# Patient Record
Sex: Male | Born: 1975 | Race: Black or African American | Hispanic: No | Marital: Single | Smoking: Never smoker
Health system: Southern US, Community
[De-identification: ages and names within clinical notes are randomized; demographics above are authoritative.]

## PROBLEM LIST (undated history)

## (undated) DIAGNOSIS — K649 Unspecified hemorrhoids: Secondary | ICD-10-CM

## (undated) HISTORY — DX: Unspecified hemorrhoids: K64.9

---

## 2007-05-19 ENCOUNTER — Emergency Department (HOSPITAL_COMMUNITY): Admission: EM | Admit: 2007-05-19 | Discharge: 2007-05-19 | Payer: Self-pay | Admitting: Emergency Medicine

## 2011-09-29 LAB — URINALYSIS, ROUTINE W REFLEX MICROSCOPIC
Hgb urine dipstick: NEGATIVE
Ketones, ur: NEGATIVE
Protein, ur: NEGATIVE
Urobilinogen, UA: 1

## 2012-04-10 ENCOUNTER — Emergency Department (HOSPITAL_COMMUNITY)
Admission: EM | Admit: 2012-04-10 | Discharge: 2012-04-10 | Disposition: A | Discharge status: Home / Self Care | Payer: Self-pay | Attending: Emergency Medicine | Admitting: Emergency Medicine

## 2012-04-10 ENCOUNTER — Emergency Department (HOSPITAL_COMMUNITY): Payer: Self-pay

## 2012-04-10 ENCOUNTER — Encounter (HOSPITAL_COMMUNITY): Payer: Self-pay | Admitting: *Deleted

## 2012-04-10 DIAGNOSIS — R110 Nausea: Secondary | ICD-10-CM | POA: Insufficient documentation

## 2012-04-10 DIAGNOSIS — R0789 Other chest pain: Secondary | ICD-10-CM | POA: Insufficient documentation

## 2012-04-10 DIAGNOSIS — R1013 Epigastric pain: Secondary | ICD-10-CM

## 2012-04-10 DIAGNOSIS — R10816 Epigastric abdominal tenderness: Secondary | ICD-10-CM | POA: Insufficient documentation

## 2012-04-10 LAB — CBC
MCHC: 34.3 g/dL (ref 30.0–36.0)
MCV: 96.7 fL (ref 78.0–100.0)
Platelets: 173 10*3/uL (ref 150–400)
RDW: 12.9 % (ref 11.5–15.5)
WBC: 3.8 10*3/uL — ABNORMAL LOW (ref 4.0–10.5)

## 2012-04-10 LAB — DIFFERENTIAL
Basophils Absolute: 0.1 10*3/uL (ref 0.0–0.1)
Basophils Relative: 1 % (ref 0–1)
Eosinophils Absolute: 0.1 10*3/uL (ref 0.0–0.7)
Eosinophils Relative: 3 % (ref 0–5)
Lymphocytes Relative: 55 % — ABNORMAL HIGH (ref 12–46)
Monocytes Absolute: 0.3 10*3/uL (ref 0.1–1.0)

## 2012-04-10 LAB — COMPREHENSIVE METABOLIC PANEL
ALT: 9 U/L (ref 0–53)
AST: 22 U/L (ref 0–37)
Albumin: 4.7 g/dL (ref 3.5–5.2)
CO2: 30 mEq/L (ref 19–32)
Calcium: 9.4 mg/dL (ref 8.4–10.5)
Creatinine, Ser: 1.15 mg/dL (ref 0.50–1.35)
Sodium: 139 mEq/L (ref 135–145)
Total Protein: 8.2 g/dL (ref 6.0–8.3)

## 2012-04-10 MED ORDER — GI COCKTAIL ~~LOC~~
30.0000 mL | Freq: Once | ORAL | Status: AC
  Administered 2012-04-10: 30 mL via ORAL
  Filled 2012-04-10: qty 30

## 2012-04-10 MED ORDER — LIDOCAINE VISCOUS 2 % MT SOLN: 20.0000 mL | OROMUCOSAL | Status: AC | PRN

## 2012-04-10 MED ORDER — ONDANSETRON 4 MG PO TBDP
4.0000 mg | ORAL_TABLET | Freq: Once | ORAL | Status: AC
  Administered 2012-04-10: 4 mg via ORAL
  Filled 2012-04-10: qty 1

## 2012-04-10 MED ORDER — OMEPRAZOLE 20 MG PO CPDR: 20.0000 mg | DELAYED_RELEASE_CAPSULE | Freq: Every day | ORAL | Status: DC
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
## 2012-04-10 NOTE — ED Notes (Signed)
Pt woke up and felt weird.  Pt sts he coughed and saw small amount of blood.  No sickness prior to.  Pt got nauseated on arrival here.

## 2012-04-10 NOTE — ED Notes (Signed)
NAD noted at time of d/c. Pt states he is not picking up RX given, he does not feel like he needs it.

## 2012-04-10 NOTE — Discharge Instructions (Signed)
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
## 2012-04-10 NOTE — ED Notes (Signed)
Pt given Sprite to drink. 

## 2012-12-21 ENCOUNTER — Emergency Department (HOSPITAL_COMMUNITY)
Admission: EM | Admit: 2012-12-21 | Discharge: 2012-12-21 | Disposition: A | Discharge status: Home / Self Care | Payer: Self-pay | Attending: Emergency Medicine | Admitting: Emergency Medicine

## 2012-12-21 ENCOUNTER — Encounter (HOSPITAL_COMMUNITY): Payer: Self-pay | Admitting: Emergency Medicine

## 2012-12-21 DIAGNOSIS — Y9289 Other specified places as the place of occurrence of the external cause: Secondary | ICD-10-CM | POA: Insufficient documentation

## 2012-12-21 DIAGNOSIS — M542 Cervicalgia: Secondary | ICD-10-CM

## 2012-12-21 DIAGNOSIS — Y939 Activity, unspecified: Secondary | ICD-10-CM | POA: Insufficient documentation

## 2012-12-21 DIAGNOSIS — S0003XA Contusion of scalp, initial encounter: Secondary | ICD-10-CM | POA: Insufficient documentation

## 2012-12-21 DIAGNOSIS — X58XXXA Exposure to other specified factors, initial encounter: Secondary | ICD-10-CM | POA: Insufficient documentation

## 2012-12-21 DIAGNOSIS — F17200 Nicotine dependence, unspecified, uncomplicated: Secondary | ICD-10-CM | POA: Insufficient documentation

## 2012-12-21 DIAGNOSIS — R21 Rash and other nonspecific skin eruption: Secondary | ICD-10-CM | POA: Insufficient documentation

## 2012-12-21 DIAGNOSIS — S1093XA Contusion of unspecified part of neck, initial encounter: Secondary | ICD-10-CM | POA: Insufficient documentation

## 2012-12-21 MED ORDER — NAPROXEN 500 MG PO TABS: 500.0000 mg | ORAL_TABLET | Freq: Two times a day (BID) | ORAL | Status: DC

## 2012-12-21 MED ORDER — NAPROXEN 250 MG PO TABS
500.0000 mg | ORAL_TABLET | Freq: Once | ORAL | Status: AC
  Administered 2012-12-21: 500 mg via ORAL
  Filled 2012-12-21: qty 2
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
## 2013-03-12 ENCOUNTER — Emergency Department (HOSPITAL_COMMUNITY)
Admission: EM | Admit: 2013-03-12 | Discharge: 2013-03-12 | Discharge status: Against Medical Advice | Payer: Self-pay | Attending: Emergency Medicine | Admitting: Emergency Medicine

## 2013-03-12 ENCOUNTER — Encounter (HOSPITAL_COMMUNITY): Payer: Self-pay | Admitting: Emergency Medicine

## 2013-03-12 DIAGNOSIS — R6884 Jaw pain: Secondary | ICD-10-CM | POA: Insufficient documentation

## 2013-03-12 NOTE — ED Notes (Signed)
Patient informed that we cannot allow patients to lie in ED waiting room; patient refuses to get up and sit in a chair.  Patient reports that he "just wants to lie in the floor because it feels good".  Patient complaining that he can't get up.  [employer] explaining to patient that we can assist him into a chair, but patient continues to refuse at this time.  Security, [employer], and RN at patient's side.

## 2013-03-12 NOTE — ED Notes (Signed)
Patient complaining of left sided jaw pain; reports that last week he had right sided jaw pain, took medications, which provided relief.  Patient reports that pain medication is not helping with the left sided jaw pain.  Patient denies dental pain.  Denies injury to jaw.
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
## 2013-03-12 NOTE — ED Notes (Signed)
Patient took off jacket and is lying in the floor.  Patient asked repeatedly to get up by RNs, nurse tech, security, and [employer].  Patient reports that he is really hot and needs to lie on the cold floor.  Patient informed that we can help him get up and sit in a chair; patient refused and stated that he is fine in the floor and does not want to sit in a chair.

## 2013-03-23 ENCOUNTER — Emergency Department (HOSPITAL_COMMUNITY)
Admission: EM | Admit: 2013-03-23 | Discharge: 2013-03-23 | Disposition: A | Discharge status: Home / Self Care | Payer: No Typology Code available for payment source | Attending: Emergency Medicine | Admitting: Emergency Medicine

## 2013-03-23 ENCOUNTER — Encounter (HOSPITAL_COMMUNITY): Payer: Self-pay | Admitting: *Deleted

## 2013-03-23 ENCOUNTER — Emergency Department (HOSPITAL_COMMUNITY): Payer: No Typology Code available for payment source

## 2013-03-23 DIAGNOSIS — M62838 Other muscle spasm: Secondary | ICD-10-CM

## 2013-03-23 DIAGNOSIS — IMO0002 Reserved for concepts with insufficient information to code with codable children: Secondary | ICD-10-CM | POA: Insufficient documentation

## 2013-03-23 DIAGNOSIS — S0993XA Unspecified injury of face, initial encounter: Secondary | ICD-10-CM | POA: Insufficient documentation

## 2013-03-23 DIAGNOSIS — F17200 Nicotine dependence, unspecified, uncomplicated: Secondary | ICD-10-CM | POA: Insufficient documentation

## 2013-03-23 DIAGNOSIS — Y9389 Activity, other specified: Secondary | ICD-10-CM | POA: Insufficient documentation

## 2013-03-23 DIAGNOSIS — Y92410 Unspecified street and highway as the place of occurrence of the external cause: Secondary | ICD-10-CM | POA: Insufficient documentation

## 2013-03-23 MED ORDER — CYCLOBENZAPRINE HCL 10 MG PO TABS
10.0000 mg | ORAL_TABLET | Freq: Once | ORAL | Status: AC
  Administered 2013-03-23: 10 mg via ORAL
  Filled 2013-03-23: qty 1

## 2013-03-23 MED ORDER — CYCLOBENZAPRINE HCL 10 MG PO TABS: 10.0000 mg | ORAL_TABLET | Freq: Two times a day (BID) | ORAL | Status: DC | PRN

## 2013-03-23 MED ORDER — OXYCODONE-ACETAMINOPHEN 5-325 MG PO TABS
1.0000 | ORAL_TABLET | Freq: Once | ORAL | Status: AC
  Administered 2013-03-23: 1 via ORAL
  Filled 2013-03-23: qty 1

## 2013-03-23 NOTE — ED Notes (Signed)
Pt c/o limited neck ROM and stiffness, c-collar applied.
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
## 2013-03-24 NOTE — ED Provider Notes (Signed)
Medical screening examination/treatment/procedure(s) were performed by non-physician practitioner and as supervising physician I was immediately available for consultation/collaboration.  Dysen Edmondson T Jackee Glasner, MD 03/24/13 0909 
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
## 2018-05-03 DIAGNOSIS — R42 Dizziness and giddiness: Secondary | ICD-10-CM | POA: Diagnosis not present

## 2018-05-12 HISTORY — PX: HEMORRHOID SURGERY: SHX153

## 2018-06-06 ENCOUNTER — Encounter (HOSPITAL_COMMUNITY): Payer: Self-pay | Admitting: Family Medicine

## 2018-06-06 DIAGNOSIS — K644 Residual hemorrhoidal skin tags: Secondary | ICD-10-CM | POA: Diagnosis not present

## 2018-06-06 DIAGNOSIS — Z87891 Personal history of nicotine dependence: Secondary | ICD-10-CM | POA: Diagnosis not present

## 2018-06-06 DIAGNOSIS — K6289 Other specified diseases of anus and rectum: Secondary | ICD-10-CM | POA: Diagnosis present

## 2018-06-06 DIAGNOSIS — Z79899 Other long term (current) drug therapy: Secondary | ICD-10-CM | POA: Diagnosis not present

## 2018-06-06 NOTE — ED Triage Notes (Signed)
Patient reports he has an external hemorrhoid that is causing pain with rectal bleeding. Symptoms started getting bad yesterday. Patient was seen at Urgent Care off of [address] but his symptoms have become worse throughout the day.

## 2018-06-07 ENCOUNTER — Emergency Department (HOSPITAL_COMMUNITY)
Admission: EM | Admit: 2018-06-07 | Discharge: 2018-06-07 | Disposition: A | Discharge status: Home / Self Care | Payer: Medicaid Other | Attending: Emergency Medicine | Admitting: Emergency Medicine

## 2018-06-07 DIAGNOSIS — K645 Perianal venous thrombosis: Secondary | ICD-10-CM | POA: Diagnosis not present

## 2018-06-07 DIAGNOSIS — K644 Residual hemorrhoidal skin tags: Secondary | ICD-10-CM

## 2018-06-07 MED ORDER — LIDOCAINE HCL URETHRAL/MUCOSAL 2 % EX GEL
1.0000 "application " | Freq: Once | CUTANEOUS | Status: AC
  Administered 2018-06-07: 1
  Filled 2018-06-07: qty 5

## 2018-06-07 NOTE — ED Provider Notes (Signed)
[facility]-EMERGENCY DEPT Provider Note   CSN: [id number] Arrival date & time: 06/06/18  2005     History   Chief Complaint Chief Complaint  Patient presents with  . Hemorrhoids    HPI James Garner is a 42 y.o. male.  42 year old male presents to the emergency department for worsening pain to an external hemorrhoid.  Symptoms began worsening yesterday.  He was seen at urgent care and started on Anusol.  He was also given pain medicine and a stool softener.  He has been using all of these medications without relief of his discomfort. He reports issues with hemorrhoids in the past, usually resolving with Preparation H; however, he has had no improvement with this on this occasion as well. Pain is worse with palpation of the area.  He denies any insertive anal practices or recent constipated bowel movements.  No associated fevers or drainage from the area.    The history is provided by the patient. No language interpreter was used.    History reviewed. No pertinent past medical history.  There are no active problems to display for this patient.   History reviewed. No pertinent surgical history.      Home Medications    Prior to Admission medications   Medication Sig Start Date End Date Taking? Authorizing Provider  cyclobenzaprine (FLEXERIL) 10 MG tablet Take 1 tablet (10 mg total) by mouth 2 (two) times daily as needed for muscle spasms. 03/23/13   Piepenbrink, Victorino DikeJennifer, PA-C  naproxen (NAPROSYN) 500 MG tablet Take 1 tablet (500 mg total) by mouth 2 (two) times daily with a meal. 12/21/12   Eber HongMiller, Brian, MD    Family History History reviewed. No pertinent family history.  Social History Social History   Tobacco Use  . Smoking status: Former Smoker    Types: Cigars  . Smokeless tobacco: Never Used  Substance Use Topics  . Alcohol use: Yes    Comment: 2-3 times a year   . Drug use: Yes    Types: Marijuana    Comment: Daily      Allergies     Patient has no known allergies.   Review of Systems Review of Systems Ten systems reviewed and are negative for acute change, except as noted in the HPI.    Physical Exam Updated Vital Signs BP (!) 146/74 (BP Location: Left Arm)   Pulse 84   Temp 98.6 F (37 C) (Oral)   Resp 16   Ht 5\' 7"  (1.702 m)   Wt 61.2 kg (135 lb)   SpO2 97%   BMI 21.14 kg/m   Physical Exam  Constitutional: He is oriented to person, place, and time. He appears well-developed and well-nourished. No distress.  Nontoxic appearing and in NAD  HENT:  Head: Normocephalic and atraumatic.  Eyes: Conjunctivae and EOM are normal. No scleral icterus.  Neck: Normal range of motion.  Pulmonary/Chest: Effort normal. No respiratory distress.  Respirations even and unlabored  Genitourinary:  Genitourinary Comments: Large external hemorrhoid; not able to be reduced. No erythema or heat to touch. No drainage. Normal rectal tone. See image below.  Musculoskeletal: Normal range of motion.  Neurological: He is alert and oriented to person, place, and time. He exhibits normal muscle tone. Coordination normal.  Skin: Skin is warm and dry. No rash noted. He is not diaphoretic. No erythema. No pallor.  Psychiatric: He has a normal mood and affect. His behavior is normal.  Nursing note and vitals reviewed.  ED Treatments / Results  Labs (all labs ordered are listed, but only abnormal results are displayed) Labs Reviewed - No data to display  EKG None  Radiology No results found.  Procedures Procedures (including critical care time)  Medications Ordered in ED Medications  lidocaine (XYLOCAINE) 2 % jelly 1 application (1 application Other Given 06/07/18 0232)     Initial Impression / Assessment and Plan / ED Course  I have reviewed the triage vital signs and the nursing notes.  Pertinent labs & imaging results that were available during my care of the patient were reviewed by me and considered in  my medical decision making (see chart for details).     42 year old male presents to the emergency department for evaluation of persistent rectal pain.  This is associated with an external hemorrhoid.  Unable to reduce hemorrhoid at bedside.  It does not appear to be specifically thrombosed.  It is suspected that the size of the hemorrhoid is causing most of the patient's discomfort.  He was started on Anusol as well as pain medicine and stool softeners at urgent care.  Will add topical lidocaine jelly and have encouraged the use of NSAIDs.  Plan for referral to general surgery to try and coordinate follow-up in the day clinic.  Return precautions discussed and provided. Patient discharged in stable condition with no unaddressed concerns.   Final Clinical Impressions(s) / ED Diagnoses   Final diagnoses:  External hemorrhoid    ED Discharge Orders    None       Antony Madura, PA-C 06/07/18 0351    Palumbo, April, MD 06/07/18 [phone]

## 2018-06-07 NOTE — Discharge Instructions (Signed)
Continue with topical lidocaine jelly as needed. Also continue to use Anusol-HC, pain medication and stool softeners. You may supplement these medications with ibuprofen 600mg  every 6 hours. Follow up with general surgery for definitive management of your hemorrhoid. Call in the morning to make an appointment.

## 2018-06-13 ENCOUNTER — Ambulatory Visit (INDEPENDENT_AMBULATORY_CARE_PROVIDER_SITE_OTHER): Payer: Medicaid Other | Admitting: Physician Assistant

## 2018-06-13 ENCOUNTER — Encounter: Payer: Self-pay | Admitting: Physician Assistant

## 2018-06-13 VITALS — BP 114/72 | HR 63 | Temp 98.6°F | Ht 67.0 in | Wt 134.8 lb

## 2018-06-13 DIAGNOSIS — Z1329 Encounter for screening for other suspected endocrine disorder: Secondary | ICD-10-CM

## 2018-06-13 DIAGNOSIS — R110 Nausea: Secondary | ICD-10-CM | POA: Diagnosis not present

## 2018-06-13 DIAGNOSIS — Z131 Encounter for screening for diabetes mellitus: Secondary | ICD-10-CM

## 2018-06-13 DIAGNOSIS — Z13220 Encounter for screening for lipoid disorders: Secondary | ICD-10-CM | POA: Diagnosis not present

## 2018-06-13 DIAGNOSIS — K649 Unspecified hemorrhoids: Secondary | ICD-10-CM

## 2018-06-13 DIAGNOSIS — Z130 Encounter for screening for diseases of the blood and blood-forming organs and certain disorders involving the immune mechanism: Secondary | ICD-10-CM | POA: Diagnosis not present

## 2018-06-13 DIAGNOSIS — N5089 Other specified disorders of the male genital organs: Secondary | ICD-10-CM

## 2018-06-13 DIAGNOSIS — Z114 Encounter for screening for human immunodeficiency virus [HIV]: Secondary | ICD-10-CM | POA: Diagnosis not present

## 2018-06-13 DIAGNOSIS — R42 Dizziness and giddiness: Secondary | ICD-10-CM | POA: Diagnosis not present

## 2018-06-13 DIAGNOSIS — N509 Disorder of male genital organs, unspecified: Secondary | ICD-10-CM | POA: Diagnosis not present

## 2018-06-13 MED ORDER — PROMETHAZINE HCL 12.5 MG PO TABS: 12.5000 mg | ORAL_TABLET | Freq: Three times a day (TID) | ORAL | 0 refills | Status: DC | PRN

## 2018-06-13 NOTE — Progress Notes (Signed)
Patient: James Garner Male    DOB: [birth year]   42 y.o.   MRN: [id number] Visit Date: 06/15/2018  Today's Provider: Trey Sailors, PA-C   Chief Complaint  Patient presents with  . Establish Care   Subjective:    HPI   James Garner is a 42 year old male who presents today to Establish Care as a new patient. He recently relocated to [place] from [place]. He was assigned by Medicaid to [facility] in [place], but states he was never seen there. Went to the ER for hemorrhoids and had a thrombosed hemorrhoid excised at [facility] in [place]. He has an upcoming return appointment for this.   Reports episodes of dizziness when he stands up, some nausea. Endorses the room spinning. Has been seen in urgent care for this a couple of months ago and was given meclizine but wanted to get it checked out by somebody else. No headache, no visual change, no weakness. He has meclizine at home but never took it.   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 He reports it has not changed since then. Denies pain swelling, problems urinating.      No Known Allergies   Current Outpatient Medications:  .  meclizine (ANTIVERT) 25 MG tablet, Take 25 mg by mouth every 6 (six) hours as needed., Disp: , Rfl: 0 .  PROCTOZONE-HC 2.5 % rectal cream, APP 1 APPLICATION ONTO THE SKIN BID, Disp: , Rfl: 0 .  STOOL SOFTENER 100 MG capsule, TK 1 TO 2 CS PO BID PRN, Disp: , Rfl: 0 .  promethazine (PHENERGAN) 12.5 MG tablet, Take 1 tablet (12.5 mg total) by mouth every 8 (eight) hours as needed for nausea or vomiting., Disp: 20 tablet, Rfl: 0   Review of Systems  Constitutional: Negative.   HENT: Negative.   Eyes: Negative.   Respiratory: Negative.   Cardiovascular: Negative.   Gastrointestinal: Positive for anal bleeding and nausea.  Endocrine: Negative.   Genitourinary: Positive for testicular pain (left testicle).  Musculoskeletal: Positive for  neck pain.  Skin: Negative.   Allergic/Immunologic: Negative.   Neurological: Positive for dizziness and light-headedness.  Hematological: Negative.   Psychiatric/Behavioral: Negative.      Social History   Tobacco Use  . Smoking status: Never Smoker  . Smokeless tobacco: Never Used  Substance Use Topics  . Alcohol use: Yes    Comment: 2-3 times a year    Objective:   BP 114/72 (BP Location: Left Arm, Patient Position: Sitting, Cuff Size: Normal)   Pulse 63   Temp 98.6 F (37 C) (Oral)   Ht 5\' 7"  (1.702 m)   Wt 134 lb 12.8 oz (61.1 kg)   SpO2 98%   BMI 21.11 kg/m    Physical Exam  Constitutional: He is oriented to person, place, and time. He appears well-developed and well-nourished.  Cardiovascular: Normal rate and regular rhythm.  Pulmonary/Chest: Effort normal and breath sounds normal.  Genitourinary: Penis normal. Right testis shows no mass, no swelling and no tenderness. Right testis is descended. Cremasteric reflex is not absent on the right side. Left testis shows mass. Left testis shows no swelling and no tenderness. Left testis is descended. Cremasteric reflex is not absent on the left side.     Neurological: He is alert and oriented to person, place, and time.  Skin: Skin is warm and dry.  Psychiatric: He has a normal mood and affect. His behavior  is normal.        Assessment & Plan:     1. Hemorrhoids, unspecified hemorrhoid type  F/u with surgery.   2. Dizziness  Sounds like vertigo. Has meclizine at home. Will send in Phenergan.   3. Testicular mass  Low suspicion for cancer. Image as below.  - US SCROTUM W/DOPPLER; Future  4. Nausea  - promethazine (PHENERGAN) 12.5 MG tablet; Take 1 tablet (12.5 mg total) by mouth every 8 (eight) hours as needed for nausea or vomiting.  Dispense: 20 tablet; Refill: 0  5. Encounter for screening for HIV  - HIV antibody (with reflex)  6. Lipid screening  - Lipid Profile  7. Diabetes mellitus  screening  - Comprehensive Metabolic Panel (CMET)  8. Thyroid disorder screening  - TSH  9. Screening for deficiency anemia  - CBC with Differential  Return in about 1 year (around 06/14/2019) for CPE.  The entirety of the information documented in the History of Present Illness, Review of Systems and Physical Exam were personally obtained by me. Portions of this information were initially documented by Presley RaddleNikki Walston, CMA and reviewed by me for thoroughness and accuracy.         Trey SailorsAdriana M Tanyah Debruyne, PA-C  [facility]

## 2018-06-13 NOTE — Patient Instructions (Signed)
Hemorrhoids    Hemorrhoids are swollen veins in and around the rectum or anus. Hemorrhoids can cause pain, itching, or bleeding. Most of the time, they do not cause serious problems. They usually get better with diet changes, lifestyle changes, and other home treatments.  Follow these instructions at home:  Eating and drinking  · Eat foods that have fiber, such as whole grains, beans, nuts, fruits, and vegetables. Ask your doctor about taking products that have added fiber (fiber supplements).  · Drink enough fluid to keep your pee (urine) clear or pale yellow.  For Pain and Swelling  · Take a warm-water bath (sitz bath) for 20 minutes to ease pain. Do this 3-4 times a day.  · If directed, put ice on the painful area. It may be helpful to use ice between your warm baths.  ¨ Put ice in a plastic bag.  ¨ Place a towel between your skin and the bag.  ¨ Leave the ice on for 20 minutes, 2-3 times a day.  General instructions  · Take over-the-counter and prescription medicines only as told by your doctor.  ¨ Medicated creams and medicines that are inserted into the anus (suppositories) may be used or applied as told.  · Exercise often.  · Go to the bathroom when you have the urge to poop (to have a bowel movement). Do not wait.  · Avoid pushing too hard (straining) when you poop.  · Keep the butt area dry and clean. Use wet toilet paper or moist paper towels.  · Do not sit on the toilet for a long time.  Contact a doctor if:  · You have any of these:  ¨ Pain and swelling that do not get better with treatment or medicine.  ¨ Bleeding that will not stop.  ¨ Trouble pooping or you cannot poop.  ¨ Pain or swelling outside the area of the hemorrhoids.  This information is not intended to replace advice given to you by your health care provider. Make sure you discuss any questions you have with your health care provider.  Document Released: 09/06/2008 Document Revised: 05/05/2016 Document Reviewed: 08/12/2015  Elsevier  Interactive Patient Education © 2018 Elsevier Inc.   

## 2018-06-14 LAB — LIPID PANEL
Chol/HDL Ratio: 3.1 ratio (ref 0.0–5.0)
Cholesterol, Total: 157 mg/dL (ref 100–199)
HDL: 50 mg/dL (ref >39)
LDL Calculated: 97 mg/dL (ref 0–99)
Triglycerides: 51 mg/dL (ref 0–149)
VLDL Cholesterol Cal: 10 mg/dL (ref 5–40)

## 2018-06-14 LAB — COMPREHENSIVE METABOLIC PANEL
ALT: 8 IU/L (ref 0–44)
AST: 19 IU/L (ref 0–40)
Albumin/Globulin Ratio: 1.6 (ref 1.2–2.2)
Albumin: 4.5 g/dL (ref 3.5–5.5)
Alkaline Phosphatase: 62 IU/L (ref 39–117)
BUN/Creatinine Ratio: 7 — ABNORMAL LOW (ref 9–20)
BUN: 8 mg/dL (ref 6–24)
Bilirubin Total: 0.5 mg/dL (ref 0.0–1.2)
CO2: 25 mmol/L (ref 20–29)
Calcium: 9.5 mg/dL (ref 8.7–10.2)
Chloride: 104 mmol/L (ref 96–106)
Creatinine, Ser: 1.07 mg/dL (ref 0.76–1.27)
GFR calc Af Amer: 97 mL/min/{1.73_m2} (ref >59)
GFR calc non Af Amer: 84 mL/min/{1.73_m2} (ref >59)
Globulin, Total: 2.9 g/dL (ref 1.5–4.5)
Glucose: 89 mg/dL (ref 65–99)
Potassium: 4.4 mmol/L (ref 3.5–5.2)
Sodium: 142 mmol/L (ref 134–144)
Total Protein: 7.4 g/dL (ref 6.0–8.5)

## 2018-06-14 LAB — CBC WITH DIFFERENTIAL/PLATELET
Basophils Absolute: 0.1 10*3/uL (ref 0.0–0.2)
Basos: 1 %
EOS (ABSOLUTE): 0.2 10*3/uL (ref 0.0–0.4)
Eos: 4 %
Hematocrit: 41.2 % (ref 37.5–51.0)
Hemoglobin: 13.8 g/dL (ref 13.0–17.7)
Immature Grans (Abs): 0 10*3/uL (ref 0.0–0.1)
Immature Granulocytes: 1 %
Lymphocytes Absolute: 1.8 10*3/uL (ref 0.7–3.1)
Lymphs: 36 %
MCH: 32.1 pg (ref 26.6–33.0)
MCHC: 33.5 g/dL (ref 31.5–35.7)
MCV: 96 fL (ref 79–97)
Monocytes Absolute: 0.5 10*3/uL (ref 0.1–0.9)
Monocytes: 10 %
Neutrophils Absolute: 2.4 10*3/uL (ref 1.4–7.0)
Neutrophils: 48 %
Platelets: 206 10*3/uL (ref 150–450)
RBC: 4.3 x10E6/uL (ref 4.14–5.80)
RDW: 12.3 % (ref 12.3–15.4)
WBC: 5 10*3/uL (ref 3.4–10.8)

## 2018-06-14 LAB — HIV ANTIBODY (ROUTINE TESTING W REFLEX): HIV Screen 4th Generation wRfx: NONREACTIVE

## 2018-06-14 LAB — TSH: TSH: 2.27 u[IU]/mL (ref 0.450–4.500)

## 2018-06-18 ENCOUNTER — Telehealth: Payer: Self-pay

## 2018-06-18 NOTE — Telephone Encounter (Signed)
Pt advised.   Thanks,   -Laura  

## 2018-06-18 NOTE — Telephone Encounter (Signed)
-----   Message from Trey SailorsAdriana M Pollak, [place] JerseyPA-C sent at 06/15/2018  8:46 AM EDT ----- Loney LaurenceLabwork is all normal. Follow up with ultrasound and we will contact with results.

## 2018-06-22 ENCOUNTER — Ambulatory Visit: Admission: RE | Admit: 2018-06-22 | Payer: Medicaid Other | Source: Ambulatory Visit

## 2018-06-22 ENCOUNTER — Telehealth: Payer: Self-pay | Admitting: Physician Assistant

## 2018-06-22 NOTE — Telephone Encounter (Signed)
FYI:   Pt did not show up for his US this am  teri

## 2018-06-22 NOTE — Telephone Encounter (Signed)
Tried calling; no answer.   Thanks,   -Laura  

## 2018-06-22 NOTE — Telephone Encounter (Signed)
Can we call him? If he wishes to get the ultrasound he will need to contact the imaging center and reschedule. If not, I will cancel the order.

## 2018-06-29 NOTE — Telephone Encounter (Signed)
Tried calling; no answer.   Thanks,   -Kimari Coudriet  

## 2018-08-14 NOTE — Progress Notes (Signed)
Patient: James Garner Male    DOB: 1976/02/24   42 y.o.   MRN: [id number] Visit Date: 08/15/2018  Today's Provider: Shirlee Latch, MD   Chief Complaint  Patient presents with  . Neck Pain   Subjective:    I, Presley Raddle, CMA, am acting as a scribe for Shirlee Latch, MD.   Neck Pain    Patient here today C/O recurrent neck pain on and off for several years. Patient that this episode started again on Monday. Patient reports pain is effecting is work. Patient reports pain is worse with movement or lifting. Patient reports pain radiates to shoulders and mid upper back. Patient reports he played football when is younger, and remembers getting injured and having some pain.   Pain is at base of neck, midline.  Feels like "someone hit me in the back with the crowbar."  Tried to sleep with a neck pillow.  Sleep makes it worse.  Hurts next day after exercise. No pain at rest.  Sharp pain with movement, Radiation to both shoulders and down to mid back      No Known Allergies   Current Outpatient Medications:  .  promethazine (PHENERGAN) 12.5 MG tablet, Take 1 tablet (12.5 mg total) by mouth every 8 (eight) hours as needed for nausea or vomiting., Disp: 20 tablet, Rfl: 0  Review of Systems  Constitutional: Negative.   Respiratory: Negative.   Cardiovascular: Negative.   Gastrointestinal: Negative.   Genitourinary: Negative.   Musculoskeletal: Positive for back pain and neck pain.  Neurological: Negative.     Social History   Tobacco Use  . Smoking status: Never Smoker  . Smokeless tobacco: Never Used  Substance Use Topics  . Alcohol use: Yes    Comment: 2-3 times a year    Objective:   BP 116/80 (BP Location: Left Arm, Patient Position: Sitting, Cuff Size: Normal)   Pulse 60   Temp 98.2 F (36.8 C) (Oral)   Resp 16   Wt 133 lb (60.3 kg)   SpO2 99%   BMI 20.83 kg/m  Vitals:   08/15/18 1003  BP: 116/80  Pulse: 60  Resp: 16  Temp: 98.2 F (36.8 C)    TempSrc: Oral  SpO2: 99%  Weight: 133 lb (60.3 kg)     Physical Exam  Constitutional: He is oriented to person, place, and time. He appears well-developed and well-nourished. No distress.  HENT:  Head: Normocephalic and atraumatic.  Eyes: Conjunctivae are normal. No scleral icterus.  Neck: Trachea normal. Neck supple. Muscular tenderness present. No spinous process tenderness present. No neck rigidity. Decreased range of motion present. No thyromegaly present.  ROM limited in all directions due to pain Strength of UEs symmetric and full, but patient does not cooperate well with testing due to pain Sensation intact grossly to light touch over UEs TTP diffusely over b/l trapezius muscles  Cardiovascular: Normal rate, regular rhythm, normal heart sounds and intact distal pulses.  No murmur heard. Pulmonary/Chest: Effort normal and breath sounds normal. No respiratory distress. He has no wheezes. He has no rales.  Musculoskeletal: He exhibits no edema or deformity.  Lymphadenopathy:    He has no cervical adenopathy.  Neurological: He is alert and oriented to person, place, and time.  Skin: Skin is warm and dry. Capillary refill takes less than 2 seconds. No rash noted.  Psychiatric: He has a normal mood and affect. His behavior is normal.  Vitals reviewed.  Assessment & Plan:   1. Trapezius muscle spasm - recurrent problem, but never evaluated - no radicular symptoms and neuro intact - reassured that this is not a disc injury - no evidence of bony abnormality - no indication for imaging at this time - tenderness, pain, and spasm of traps b/l - discussed heat, stretching - patient cannot tolerate NSAIDs - muscle relaxer prn - HEP given - discussed natural course of waxing and waning and discussed return precautions - answered all questions and concerns   Meds ordered this encounter  Medications  . methocarbamol (ROBAXIN) 500 MG tablet    Sig: Take 1 tablet (500 mg  total) by mouth every 6 (six) hours as needed for muscle spasms.    Dispense:  90 tablet    Refill:  0     Return if symptoms worsen or fail to improve.  Approximately 25 minutes was spent in discussion of which greater than 50% was consultation.    The entirety of the information documented in the History of Present Illness, Review of Systems and Physical Exam were personally obtained by me. Portions of this information were initially documented by Presley Raddle and Rollen Sox, CMA and reviewed by me for thoroughness and accuracy.    Erasmo Downer, MD, MPH [facility] 08/15/2018 11:41 AM

## 2018-08-15 ENCOUNTER — Encounter: Payer: Self-pay | Admitting: Family Medicine

## 2018-08-15 ENCOUNTER — Ambulatory Visit: Payer: Medicaid Other | Admitting: Family Medicine

## 2018-08-15 VITALS — BP 116/80 | HR 60 | Temp 98.2°F | Resp 16 | Wt 133.0 lb

## 2018-08-15 DIAGNOSIS — M62838 Other muscle spasm: Secondary | ICD-10-CM | POA: Diagnosis not present

## 2018-08-15 DIAGNOSIS — S134XXA Sprain of ligaments of cervical spine, initial encounter: Secondary | ICD-10-CM | POA: Diagnosis not present

## 2018-08-15 MED ORDER — METHOCARBAMOL 500 MG PO TABS: 500.0000 mg | ORAL_TABLET | Freq: Four times a day (QID) | ORAL | 0 refills | Status: DC | PRN

## 2018-08-15 NOTE — Patient Instructions (Signed)

## 2018-08-21 ENCOUNTER — Ambulatory Visit: Payer: Medicaid Other | Admitting: Physician Assistant

## 2018-08-21 ENCOUNTER — Telehealth: Payer: Self-pay

## 2018-08-21 ENCOUNTER — Encounter: Payer: Self-pay | Admitting: Physician Assistant

## 2018-08-21 VITALS — BP 110/80 | HR 68 | Temp 98.3°F | Resp 16 | Wt 132.0 lb

## 2018-08-21 DIAGNOSIS — H8110 Benign paroxysmal vertigo, unspecified ear: Secondary | ICD-10-CM

## 2018-08-21 DIAGNOSIS — H6122 Impacted cerumen, left ear: Secondary | ICD-10-CM

## 2018-08-21 DIAGNOSIS — M5030 Other cervical disc degeneration, unspecified cervical region: Secondary | ICD-10-CM

## 2018-08-21 MED ORDER — METHYLPREDNISOLONE 4 MG PO TBPK: ORAL_TABLET | ORAL | 0 refills | Status: DC

## 2018-08-21 NOTE — Telephone Encounter (Signed)
Patient calling to scheduled appointment with Ricki Rodriguez for "chronic issues" patient was informed that Ricki Rodriguez is on vacation but he wanted to wait for Adriana. Told patient that was ok. Then patient started to say that he started vomiting blood in the last two days. When patient stated that I advised him he needed to be seen sooner and advised that since he only wanted to see Ricki Rodriguez he needed to go to an urgent care or ED patient declined and requested an appointment to be seen today with a different provider.

## 2018-08-21 NOTE — Patient Instructions (Addendum)
Degenerative Disk Disease Degenerative disk disease is a condition caused by the changes that occur in spinal disks as you grow older. Spinal disks are soft and compressible disks located between the bones of your spine (vertebrae). These disks act like shock absorbers. Degenerative disk disease can affect the whole spine. However, the neck and lower back are most commonly affected. Many changes can occur in the spinal disks with aging, such as:  The spinal disks may dry and shrink.  Small tears may occur in the tough, outer covering of the disk (annulus).  The disk space may become smaller due to loss of water.  Abnormal growths in the bone (spurs) may occur. This can put pressure on the nerve roots exiting the spinal canal, causing pain.  The spinal canal may become narrowed.  What increases the risk?  Being overweight.  Having a family history of degenerative disk disease.  Smoking.  There is increased risk if you are doing heavy lifting or have a sudden injury. What are the signs or symptoms? Symptoms vary from person to person and may include:  Pain that varies in intensity. Some people have no pain, while others have severe pain. The location of the pain depends on the part of your backbone that is affected. ? You will have neck or arm pain if a disk in the neck area is affected. ? You will have pain in your back, buttocks, or legs if a disk in the lower back is affected.  Pain that becomes worse while bending, reaching up, or with twisting movements.  Pain that may start gradually and then get worse as time passes. It may also start after a major or minor injury.  Numbness or tingling in the arms or legs.  How is this diagnosed? Your health care provider will ask you about your symptoms and about activities or habits that may cause the pain. He or she may also ask about any injuries, diseases, or treatments you have had. Your health care provider will examine you to check  for the range of movement that is possible in the affected area, to check for strength in your extremities, and to check for sensation in the areas of the arms and legs supplied by different nerve roots. You may also have:  An X-ray of the spine.  Other imaging tests, such as MRI.  How is this treated? Your health care provider will advise you on the best plan for treatment. Treatment may include:  Medicines.  Rehabilitation exercises.  Follow these instructions at home:  Follow proper lifting and walking techniques as advised by your health care provider.  Maintain good posture.  Exercise regularly as advised by your health care provider.  Perform relaxation exercises.  Change your sitting, standing, and sleeping habits as advised by your health care provider.  Change positions frequently.  Lose weight or maintain a healthy weight as advised by your health care provider.  Do not use any tobacco products, including cigarettes, chewing tobacco, or electronic cigarettes. If you need help quitting, ask your health care provider.  Wear supportive footwear.  Take medicines only as directed by your health care provider. Contact a health care provider if:  Your pain does not go away within 1-4 weeks.  You have significant appetite or weight loss. Get help right away if:  Your pain is severe.  You notice weakness in your arms, hands, or legs.  You begin to lose control of your bladder or bowel movements.  You have  fevers or night sweats. This information is not intended to replace advice given to you by your health care provider. Make sure you discuss any questions you have with your health care provider. Document Released: 09/25/2007 Document Revised: 05/05/2016 Document Reviewed: 04/01/2014 Elsevier Interactive Patient Education  2018 [employer]   Neck Exercises Neck exercises can be important for many reasons:  They can help you to improve and maintain  flexibility in your neck. This can be especially important as you age.  They can help to make your neck stronger. This can make movement easier.  They can reduce or prevent neck pain.  They may help your upper back.  Ask your health care provider which neck exercises would be best for you. Exercises Neck Press Repeat this exercise 10 times. Do it first thing in the morning and right before bed or as told by your health care provider. 1. Lie on your back on a firm bed or on the floor with a pillow under your head. 2. Use your neck muscles to push your head down on the pillow and straighten your spine. 3. Hold the position as well as you can. Keep your head facing up and your chin tucked. 4. Slowly count to 5 while holding this position. 5. Relax for a few seconds. Then repeat.  Isometric Strengthening Do a full set of these exercises 2 times a day or as told by your health care provider. 1. Sit in a supportive chair and place your hand on your forehead. 2. Push forward with your head and neck while pushing back with your hand. Hold for 10 seconds. 3. Relax. Then repeat the exercise 3 times. 4. Next, do thesequence again, this time putting your hand against the back of your head. Use your head and neck to push backward against the hand pressure. 5. Finally, do the same exercise on either side of your head, pushing sideways against the pressure of your hand.  Prone Head Lifts Repeat this exercise 5 times. Do this 2 times a day or as told by your health care provider. 1. Lie face-down, resting on your elbows so that your chest and upper back are raised. 2. Start with your head facing downward, near your chest. Position your chin either on or near your chest. 3. Slowly lift your head upward. Lift until you are looking straight ahead. Then continue lifting your head as far back as you can stretch. 4. Hold your head up for 5 seconds. Then slowly lower it to your starting position.  Supine  Head Lifts Repeat this exercise 8-10 times. Do this 2 times a day or as told by your health care provider. 1. Lie on your back, bending your knees to point to the ceiling and keeping your feet flat on the floor. 2. Lift your head slowly off the floor, raising your chin toward your chest. 3. Hold for 5 seconds. 4. Relax and repeat.  Scapular Retraction Repeat this exercise 5 times. Do this 2 times a day or as told by your health care provider. 1. Stand with your arms at your sides. Look straight ahead. 2. Slowly pull both shoulders backward and downward until you feel a stretch between your shoulder blades in your upper back. 3. Hold for 10-30 seconds. 4. Relax and repeat.  Contact a health care provider if:  Your neck pain or discomfort gets much worse when you do an exercise.  Your neck pain or discomfort does not improve within 2 hours after you exercise.  If you have any of these problems, stop exercising right away. Do not do the exercises again unless your health care provider says that you can. Get help right away if:  You develop sudden, severe neck pain. If this happens, stop exercising right away. Do not do the exercises again unless your health care provider says that you can. Exercises Neck Stretch  Repeat this exercise 3-5 times. 1. Do this exercise while standing or while sitting in a chair. 2. Place your feet flat on the floor, shoulder-width apart. 3. Slowly turn your head to the right. Turn it all the way to the right so you can look over your right shoulder. Do not tilt or tip your head. 4. Hold this position for 10-30 seconds. 5. Slowly turn your head to the left, to look over your left shoulder. 6. Hold this position for 10-30 seconds.  Neck Retraction Repeat this exercise 8-10 times. Do this 3-4 times a day or as told by your health care provider. 1. Do this exercise while standing or while sitting in a sturdy chair. 2. Look straight ahead. Do not bend your  neck. 3. Use your fingers to push your chin backward. Do not bend your neck for this movement. Continue to face straight ahead. If you are doing the exercise properly, you will feel a slight sensation in your throat and a stretch at the back of your neck. 4. Hold the stretch for 1-2 seconds. Relax and repeat.  This information is not intended to replace advice given to you by your health care provider. Make sure you discuss any questions you have with your health care provider. Document Released: 11/09/2015 Document Revised: 05/05/2016 Document Reviewed: 06/08/2015 Elsevier Interactive Patient Education  2018 [employer].  Benign Positional Vertigo Vertigo is the feeling that you or your surroundings are moving when they are not. Benign positional vertigo is the most common form of vertigo. The cause of this condition is not serious (is benign). This condition is triggered by certain movements and positions (is positional). This condition can be dangerous if it occurs while you are doing something that could endanger you or others, such as driving. What are the causes? In many cases, the cause of this condition is not known. It may be caused by a disturbance in an area of the inner ear that helps your brain to sense movement and balance. This disturbance can be caused by a viral infection (labyrinthitis), head injury, or repetitive motion. What increases the risk? This condition is more likely to develop in:  Women.  People who are 15 years of age or older.  What are the signs or symptoms? Symptoms of this condition usually happen when you move your head or your eyes in different directions. Symptoms may start suddenly, and they usually last for less than a minute. Symptoms may include:  Loss of balance and falling.  Feeling like you are spinning or moving.  Feeling like your surroundings are spinning or moving.  Nausea and vomiting.  Blurred vision.  Dizziness.  Involuntary eye  movement (nystagmus).  Symptoms can be mild and cause only slight annoyance, or they can be severe and interfere with daily life. Episodes of benign positional vertigo may return (recur) over time, and they may be triggered by certain movements. Symptoms may improve over time. How is this diagnosed? This condition is usually diagnosed by medical history and a physical exam of the head, neck, and ears. You may be referred to a health care provider  who specializes in ear, nose, and throat (ENT) problems (otolaryngologist) or a provider who specializes in disorders of the nervous system (neurologist). You may have additional testing, including:  MRI.  A CT scan.  Eye movement tests. Your health care provider may ask you to change positions quickly while he or she watches you for symptoms of benign positional vertigo, such as nystagmus. Eye movement may be tested with an electronystagmogram (ENG), caloric stimulation, the Dix-Hallpike test, or the roll test.  An electroencephalogram (EEG). This records electrical activity in your brain.  Hearing tests.  How is this treated? Usually, your health care provider will treat this by moving your head in specific positions to adjust your inner ear back to normal. Surgery may be needed in severe cases, but this is rare. In some cases, benign positional vertigo may resolve on its own in 2-4 weeks. Follow these instructions at home: Safety  Move slowly.Avoid sudden body or head movements.  Avoid driving.  Avoid operating heavy machinery.  Avoid doing any tasks that would be dangerous to you or others if a vertigo episode would occur.  If you have trouble walking or keeping your balance, try using a cane for stability. If you feel dizzy or unstable, sit down right away.  Return to your normal activities as told by your health care provider. Ask your health care provider what activities are safe for you. General instructions  Take over-the-counter  and prescription medicines only as told by your health care provider.  Avoid certain positions or movements as told by your health care provider.  Drink enough fluid to keep your urine clear or pale yellow.  Keep all follow-up visits as told by your health care provider. This is important. Contact a health care provider if:  You have a fever.  Your condition gets worse or you develop new symptoms.  Your family or friends notice any behavioral changes.  Your nausea or vomiting gets worse.  You have numbness or a "pins and needles" sensation. Get help right away if:  You have difficulty speaking or moving.  You are always dizzy.  You faint.  You develop severe headaches.  You have weakness in your legs or arms.  You have changes in your hearing or vision.  You develop a stiff neck.  You develop sensitivity to light. This information is not intended to replace advice given to you by your health care provider. Make sure you discuss any questions you have with your health care provider. Document Released: 09/05/2006 Document Revised: 05/05/2016 Document Reviewed: 03/23/2015 Elsevier Interactive Patient Education  2018 [employer].  How to Perform the Epley Maneuver The Epley maneuver is an exercise that relieves symptoms of vertigo. Vertigo is the feeling that you or your surroundings are moving when they are not. When you feel vertigo, you may feel like the room is spinning and have trouble walking. Dizziness is a little different than vertigo. When you are dizzy, you may feel unsteady or light-headed. You can do this maneuver at home whenever you have symptoms of vertigo. You can do it up to 3 times a day until your symptoms go away. Even though the Epley maneuver may relieve your vertigo for a few weeks, it is possible that your symptoms will return. This maneuver relieves vertigo, but it does not relieve dizziness. What are the risks? If it is done correctly, the Epley  maneuver is considered safe. Sometimes it can lead to dizziness or nausea that goes away after a short  time. If you develop other symptoms, such as changes in vision, weakness, or numbness, stop doing the maneuver and call your health care provider. How to perform the Epley maneuver 1. Sit on the edge of a bed or table with your back straight and your legs extended or hanging over the edge of the bed or table. 2. Turn your head halfway toward the affected ear or side. 3. Lie backward quickly with your head turned until you are lying flat on your back. You may want to position a pillow under your shoulders. 4. Hold this position for 30 seconds. You may experience an attack of vertigo. This is normal. 5. Turn your head to the opposite direction until your unaffected ear is facing the floor. 6. Hold this position for 30 seconds. You may experience an attack of vertigo. This is normal. Hold this position until the vertigo stops. 7. Turn your whole body to the same side as your head. Hold for another 30 seconds. 8. Sit back up. You can repeat this exercise up to 3 times a day. Follow these instructions at home:  After doing the Epley maneuver, you can return to your normal activities.  Ask your health care provider if there is anything you should do at home to prevent vertigo. He or she may recommend that you: ? Keep your head raised (elevated) with two or more pillows while you sleep. ? Do not sleep on the side of your affected ear. ? Get up slowly from bed. ? Avoid sudden movements during the day. ? Avoid extreme head movement, like looking up or bending over. Contact a health care provider if:  Your vertigo gets worse.  You have other symptoms, including: ? Nausea. ? Vomiting. ? Headache. Get help right away if:  You have vision changes.  You have a severe or worsening headache or neck pain.  You cannot stop vomiting.  You have new numbness or weakness in any part of your  body. Summary  Vertigo is the feeling that you or your surroundings are moving when they are not.  The Epley maneuver is an exercise that relieves symptoms of vertigo.  If the Epley maneuver is done correctly, it is considered safe. You can do it up to 3 times a day. This information is not intended to replace advice given to you by your health care provider. Make sure you discuss any questions you have with your health care provider. Document Released: 12/03/2013 Document Revised: 10/18/2016 Document Reviewed: 10/18/2016 Elsevier Interactive Patient Education  2017 [employer].

## 2018-08-21 NOTE — Progress Notes (Signed)
Patient: James Garner Male    DOB: October 15, 1976   42 y.o.   MRN: [id number] Visit Date: 08/21/2018  Today's Provider: Margaretann Loveless, PA-C   No chief complaint on file.  Subjective:    HPI Patient here today C/O dizziness while laying down x;s one week. Patient reports that he vomitted in the last 2 nights with dizziness. Patient reports that he is not dizzy while standing or sitting. Patient reports he stopped taking promethazine due to dizziness while laying down.   Patient C/O persistent neck pain. Patient reports that he had an x-ray done 08/15/18. Patient reports x-ray was normal. Patient reports that the doctor at [facility] urgent [facility] advised to have an MRI of his neck. Patient reports methocarbamol did not help with neck pain. Patient reports pain is worse with lifting or arm movement.    No Known Allergies   Current Outpatient Medications:  .  promethazine (PHENERGAN) 12.5 MG tablet, Take 1 tablet (12.5 mg total) by mouth every 8 (eight) hours as needed for nausea or vomiting., Disp: 20 tablet, Rfl: 0  Review of Systems  Constitutional: Negative.   Cardiovascular: Negative.   Musculoskeletal: Positive for neck pain.  Neurological: Positive for dizziness and light-headedness.    Social History   Tobacco Use  . Smoking status: Never Smoker  . Smokeless tobacco: Never Used  Substance Use Topics  . Alcohol use: Yes    Comment: 2-3 times a year    Objective:   BP 110/80 (BP Location: Left Arm, Patient Position: Sitting, Cuff Size: Normal)   Pulse 68   Temp 98.3 F (36.8 C) (Oral)   Resp 16   Wt 132 lb (59.9 kg)   SpO2 99%   BMI 20.67 kg/m  Vitals:   08/21/18 1605  BP: 110/80  Pulse: 68  Resp: 16  Temp: 98.3 F (36.8 C)  TempSrc: Oral  SpO2: 99%  Weight: 132 lb (59.9 kg)     Physical Exam  Constitutional: He appears well-developed and well-nourished. No distress.  HENT:  Head: Normocephalic and atraumatic.  Right Ear: Hearing, tympanic  membrane, external ear and ear canal normal. Tympanic membrane is not erythematous and not bulging. No middle ear effusion.  Left Ear: Hearing, tympanic membrane, external ear and ear canal normal. Tympanic membrane is not erythematous and not bulging.  No middle ear effusion.  Nose: Mucosal edema and rhinorrhea present. Right sinus exhibits no maxillary sinus tenderness and no frontal sinus tenderness. Left sinus exhibits no maxillary sinus tenderness and no frontal sinus tenderness.  Mouth/Throat: Uvula is midline, oropharynx is clear and moist and mucous membranes are normal. No oropharyngeal exudate, posterior oropharyngeal edema or posterior oropharyngeal erythema.  Cerumen impaction noted in left ear. Lavage performed and successful  Eyes: Pupils are equal, round, and reactive to light. Conjunctivae and EOM are normal. Right eye exhibits no discharge. Left eye exhibits no discharge.  Neck: Normal range of motion. Neck supple. No tracheal deviation present. No Brudzinski's sign and no Kernig's sign noted. No thyromegaly present.  Cardiovascular: Normal rate, regular rhythm and normal heart sounds. Exam reveals no gallop and no friction rub.  No murmur heard. Pulmonary/Chest: Effort normal and breath sounds normal. No stridor. No respiratory distress. He has no wheezes. He has no rales.  Lymphadenopathy:    He has no cervical adenopathy.  Skin: Skin is warm and dry. He is not diaphoretic.  Vitals reviewed.       Assessment & Plan:  1. DDD (degenerative disc disease), cervical Noted on xray from [facility]. Will try medrol dose pak as below for inflammation and pain associated with DDD. Willing to also try PT. Referral placed as below. He is to call if symptoms worsen or fail to image.  - methylPREDNISolone (MEDROL) 4 MG TBPK tablet; 6 day taper; take as directed on package instructions  Dispense: 21 tablet; Refill: 0 - Ambulatory referral to Physical Therapy  2. Benign paroxysmal  positional vertigo, unspecified laterality Long standing. Was shown how to perform Epley maneuver and Brandt-Daroff exercises. Referral placed for balance therapy.  - Ambulatory referral to Physical Therapy - Ambulatory referral to Physical Therapy  3. Impacted cerumen of left ear Successful lavage. Advised to use Debrox weekly to keep wax soft.  - Ear Lavage       Margaretann Loveless, PA-C  [facility]

## 2018-08-22 ENCOUNTER — Ambulatory Visit: Payer: Medicaid Other | Admitting: Family Medicine

## 2018-08-27 ENCOUNTER — Telehealth: Payer: Self-pay | Admitting: Physician Assistant

## 2018-08-27 DIAGNOSIS — M5030 Other cervical disc degeneration, unspecified cervical region: Secondary | ICD-10-CM

## 2018-08-27 MED ORDER — MELOXICAM 7.5 MG PO TABS: 7.5000 mg | ORAL_TABLET | Freq: Every day | ORAL | 0 refills | Status: DC

## 2018-08-27 NOTE — Telephone Encounter (Signed)
Pt was in for neck pain last week and was given Medrol.  He states it is not helping actually seemed like the pain was worse. He stopped taking and the pain has stopped some  Please advise  CB#  [phone]  Thanks  James Garner

## 2018-08-27 NOTE — Telephone Encounter (Signed)
[facility] [place] [facility] [place]

## 2018-08-27 NOTE — Telephone Encounter (Signed)
Noted. Will send meloxicam for him to try once daily.

## 2018-09-05 ENCOUNTER — Encounter: Payer: Self-pay | Admitting: Physical Therapy

## 2018-09-05 ENCOUNTER — Ambulatory Visit: Payer: Medicaid Other | Attending: Physician Assistant | Admitting: Physical Therapy

## 2018-09-05 DIAGNOSIS — M542 Cervicalgia: Secondary | ICD-10-CM | POA: Insufficient documentation

## 2018-09-05 DIAGNOSIS — M5030 Other cervical disc degeneration, unspecified cervical region: Secondary | ICD-10-CM | POA: Insufficient documentation

## 2018-09-05 NOTE — Therapy (Signed)
[facility] [address] [place], [place], [address] Phone: [phone]   Fax:  [phone]  Physical Therapy Evaluation  Patient Details  Name: James Garner MRN: [id number] Date of Birth: 08-Apr-1976 Referring Provider: Joycelyn Man PA-C   Encounter Date: 09/05/2018  PT End of Session - 09/05/18 1034    Visit Number  1    Number of Visits  6    Date for PT Re-Evaluation  10/17/18    Authorization Type  MCD    PT Start Time  0930    PT Stop Time  1015    PT Time Calculation (min)  45 min    Activity Tolerance  Patient tolerated treatment well    Behavior During Therapy  [facility] for tasks assessed/performed       Past Medical History:  Diagnosis Date  . Hemorrhoids     Past Surgical History:  Procedure Laterality Date  . HEMORRHOID SURGERY  05/2018    There were no vitals filed for this visit.   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 He was having dizziness and was diagnosed with BPPV a month ago but he relays this has resolved.     Limitations  Lifting;Sitting    How long can you sit comfortably?  10 min    Diagnostic tests  x-rays, pt reports show cervvical DDD    Patient Stated Goals  get the pain down    Currently in Pain?  Yes    Pain Score  9    2-3 at rest, 9 with activity   Pain Location  Neck    Pain Orientation  Right;Left;Mid;Lower    Pain Descriptors / Indicators  Aching;Sharp;Tightness    Pain Type  Chronic pain    Pain Radiating Towards  denies radiculopathy but some pain referring to Lt scapula    Pain Onset  More than a month ago    Pain Frequency  Constant    Aggravating Factors   moving his head, any lifting, carrying, sleeping    Pain Relieving Factors  rest     Multiple Pain Sites  No         [facility] PT Assessment - 09/05/18 0001      Assessment   Medical Diagnosis  Cervical DDD    Referring Provider  Joycelyn Man PA-C    Onset Date/Surgical Date  --   one year onset   Next MD Visit  ?    Prior Therapy  none      Precautions   Precautions  None      Balance Screen   Has the patient fallen in the past 6 months  No      Home Environment   Living Environment  Private residence      Prior Function   Level of Independence  Independent    Vocation Requirements  life insurance agent    Leisure  drone pilot      Cognition   Overall Cognitive Status  Within Functional Limits for tasks assessed      Observation/Other Assessments   Focus on Therapeutic Outcomes (FOTO)   MCD      Sensation   Light Touch  Appears Intact  ROM / Strength   AROM / PROM / Strength  AROM;Strength      AROM   AROM Assessment Site  Cervical    Cervical Flexion  30    Cervical Extension  25    Cervical - Right Side Bend  35    Cervical - Left Side Bend  30    Cervical - Right Rotation  70    Cervical - Left Rotation  45      Strength   Overall Strength Comments  overall UE strength 4+/5 MMT bilat grossly      Flexibility   Soft Tissue Assessment /Muscle Length  --   tight U.T. and cerv P.S. bilat     Palpation   Spinal mobility  normal upper Cerv, hypomobile lower Cerv and upper thoracic    Palpation comment  TTP C4-T4, UT bilat      Special Tests    Special Tests  Cervical    Other special tests  normal tracking and gaze stab, no nystagmus and no complaints of dizziness with head shaking or bed mobiliy so dix hallpike not indicated and BPPV not suspected    Cervical Tests  Spurling's;Dictraction      Spurling's   Findings  Negative    Side  --   both     Distraction Test   Findngs  Negative      Ambulation/Gait   Gait Comments  WFL                Objective measurements completed on examination: See above findings.       [facility] Adult PT Treatment/Exercise - 09/05/18 0001      Exercises   Exercises  Neck      Neck Exercises: Seated   Neck Retraction  20 reps      Modalities   Modalities  Moist Heat             PT Education - 09/05/18 1034    Education Details  HEP, POC    Person(s) Educated  Patient    Methods  Explanation;Demonstration;Verbal cues;Handout    Comprehension  Verbalized understanding;Need further instruction       PT Short Term Goals - 09/05/18 1040      PT SHORT TERM GOAL #1   Title  Pt will be I and compliant with initial HEP. 3 weeks 09/26/18    Baseline  no HEP until today    Status  New      PT SHORT TERM GOAL #2   Title  Pt will report pain decrease by 1/10. 3 weeks 09/26/18    Baseline  9/10 overall with activity    Status  New        PT Long Term Goals - 09/05/18 1042      PT LONG TERM GOAL #1   Title  Pt will report less than 4/10 overall pain with ususal activity. 6 weeks 10/17/18    Baseline  9/10 overall    Status  New      PT LONG TERM GOAL #2   Title  Pt will improve cervical ROM to [facility] to improve function. 6 weeks 10/17/18    Baseline  50% ROM     Status  New      PT LONG TERM GOAL #3   Title  Pt will improve sleep quality to improve function. 6 weeks 10/17/18    Baseline  poor sleep quality due to 9/10 pain and discomfort    Status  New             Plan - 09/05/18 1036    Clinical Impression Statement  Pt presents with chronic neck pain, and stiffness, with DDD. He also had diagnosis of BPPPV but this seems to have completely resolved and he had no symptoms of this during exam. He has decreased ROM, decreased activity tolerance, decreased neuromuscular control, spinal hypomobility, and increased pain limiting his full function. He will benfit from skilled PT to address these deficits.     History and Personal Factors relevant to plan of care:  chronic nature of pain    Clinical Presentation  Evolving    Clinical Presentation due  to:  high irritability of sympoms with poor activity tolerance    Clinical Decision Making  Moderate    Rehab Potential  Fair    Clinical Impairments Affecting Rehab Potential  chronic pain    PT Frequency  2x / week   1-2   PT Duration  6 weeks    PT Treatment/Interventions  Cryotherapy;Electrical Stimulation;Iontophoresis 4mg /ml Dexamethasone;Moist Heat;Traction;Ultrasound;Therapeutic exercise;Therapeutic activities;Neuromuscular re-education;Manual techniques;Passive range of motion;Dry needling;Taping;Spinal Manipulations;Joint Manipulations    PT Next Visit Plan  review HEP, gentle stretching and mobility, scapular strengthening, MT and modalties PRN    PT Home Exercise Plan  AAROM and gentle neck stretches    Consulted and Agree with Plan of Care  Patient       Patient will benefit from skilled therapeutic intervention in order to improve the following deficits and impairments:  Decreased activity tolerance, Decreased endurance, Decreased range of motion, Decreased strength, Hypomobility, Impaired flexibility, Pain, Postural dysfunction  Visit Diagnosis: Cervicalgia  DDD (degenerative disc disease), cervical     Problem List Patient Active Problem List   Diagnosis Date Noted  . Hemorrhoids 06/13/2018  . Dizziness 06/13/2018    April Manson, PT, DPT 09/05/2018, 10:46 AM  [facility] [address] [place], [place], [address] Phone: [phone]   Fax:  [phone]  Name: James Garner MRN: [id number] Date of Birth: September 14, 1976

## 2018-09-14 ENCOUNTER — Encounter

## 2018-09-21 ENCOUNTER — Ambulatory Visit: Payer: Medicaid Other | Attending: Physician Assistant | Admitting: Physical Therapy

## 2018-09-21 ENCOUNTER — Encounter: Payer: Self-pay | Admitting: Physical Therapy

## 2018-09-21 DIAGNOSIS — M542 Cervicalgia: Secondary | ICD-10-CM

## 2018-09-21 DIAGNOSIS — M5030 Other cervical disc degeneration, unspecified cervical region: Secondary | ICD-10-CM | POA: Diagnosis not present

## 2018-09-21 NOTE — Therapy (Signed)
[facility] [address] [place], [place], [address] Phone: [phone]   Fax:  [phone]  Physical Therapy Treatment  Patient Details  Name: James Garner MRN: [id number] Date of Birth: 03-Feb-1976 Referring Provider (PT): Joycelyn Man PA-C   Encounter Date: 09/21/2018  PT End of Session - 09/21/18 1028    Visit Number  2   2nd visit including eval, 1 of 3 approved treatment visits   Number of Visits  6    Date for PT Re-Evaluation  10/17/18    Authorization Type  Medicaid-3 visits approved     Authorization Time Period  09/20/18-10/20/18    Authorization - Visit Number  1   1 of 3 approved visits   Authorization - Number of Visits  3    PT Start Time  0847    PT Stop Time  0938    PT Time Calculation (min)  51 min    Activity Tolerance  Patient tolerated treatment well   some soreness with PA mobilizations otherwise session well-tolerated      Past Medical History:  Diagnosis Date  . Hemorrhoids     Past Surgical History:  Procedure Laterality Date  . HEMORRHOID SURGERY  05/2018    There were no vitals filed for this visit.  Subjective Assessment - 09/21/18 1019    Subjective  Pt. reports neck pain is a "contant ache" 3/10. Pain is primarily CT junction region. Pt. reports also has left upper extremity numbness and tingling distal to hand but no radiating pain. He reports good HEP compliance since eval visit. No further issues with BPPV.    Limitations  Lifting;Sitting    How long can you sit comfortably?  10 min    Diagnostic tests  x-rays, pt reports show cervvical DDD    Patient Stated Goals  get the pain down    Currently in Pain?  Yes    Pain Score  3     Pain Location  Neck    Pain Orientation  --   CT junction region   Pain Descriptors / Indicators  Aching    Pain Type  Chronic pain    Pain Onset  More than a month ago    Pain Frequency  Constant    Aggravating Factors   lifting activities    Pain Relieving Factors  rest    Multiple Pain Sites  No                       [facility] Adult PT Treatment/Exercise - 09/21/18 0001      Exercises   Exercises  Neck      Neck Exercises: Theraband   Scapula Retraction  20 reps;Green      Neck Exercises: Seated   Cervical Isometrics  Flexion   isometric with fist under chin 5 sec x 15   Neck Retraction  20 reps   supine today with gentle assistance   Other Seated Exercise  seated thoracic extension ROM with rolled pillow behind upper back x 15 reps      Neck Exercises: Supine   Other Supine Exercise  --      Modalities   Modalities  Moist Heat      Moist Heat Therapy   Number Minutes Moist Heat  10 Minutes    Moist Heat Location  Cervical   seated     Manual Therapy   Manual Therapy  Manual Traction;Soft tissue mobilization;Myofascial release;Other (comment)  spinal mobilization   Soft tissue mobilization  seated STM CT junction region paraspinals, upper trapezius region bilat.    Myofascial Release  bilat. upper trapezius in supine    Manual Traction  supine cervical manual traction x 7 min    Other Manual Therapy  prone PA mobilization: thoracic grade I-IV, lower cervicals grade I-III, suipine lower cervical lateral glides grade I-III      Neck Exercises: Stretches   Upper Trapezius Stretch  Right;Left;3 reps;30 seconds   supine manual stretch            PT Education - 09/21/18 1028    Education Details  HEP, etiology DDD/potential radicular symptoms with spine anatomy review using model    Person(s) Educated  Patient    Methods  Explanation;Demonstration;Verbal cues    Comprehension  Verbalized understanding       PT Short Term Goals - 09/05/18 1040      PT SHORT TERM GOAL #1   Title  Pt will be I and compliant with initial HEP. 3 weeks 09/26/18    Baseline  no HEP until today    Status  New      PT SHORT TERM GOAL #2   Title  Pt will report pain decrease by 1/10. 3 weeks 09/26/18     Baseline  9/10 overall with activity    Status  New        PT Long Term Goals - 09/05/18 1042      PT LONG TERM GOAL #1   Title  Pt will report less than 4/10 overall pain with ususal activity. 6 weeks 10/17/18    Baseline  9/10 overall    Status  New      PT LONG TERM GOAL #2   Title  Pt will improve cervical ROM to [facility] to improve function. 6 weeks 10/17/18    Baseline  50% ROM     Status  New      PT LONG TERM GOAL #3   Title  Pt will improve sleep quality to improve function. 6 weeks 10/17/18    Baseline  poor sleep quality due to 9/10 pain and discomfort    Status  New            Plan - 09/21/18 1031    Clinical Impression Statement  Pt. continues with CT junction region pain as well as Lt. UE parasthesias which could be radicular in etiology. Contributing postural factors with forward head and CT junction region hypomobility. Some soreness with PA mobilizations otherwise session well-tolerated though limited effect for symptom centralization. Pt. would benefit from continued PT for further progress ot address associated functional limitations.    Rehab Potential  Fair    Clinical Impairments Affecting Rehab Potential  chronic pain    PT Treatment/Interventions  Cryotherapy;Electrical Stimulation;Iontophoresis 4mg /ml Dexamethasone;Moist Heat;Traction;Ultrasound;Therapeutic exercise;Therapeutic activities;Neuromuscular re-education;Manual techniques;Passive range of motion;Dry needling;Taping;Spinal Manipulations;Joint Manipulations    PT Next Visit Plan  Check response manual therapy and continue as found beneficial, check for centralization response from retractions, continue postural strengthening, stabilization, ROM, modalities prn    PT Home Exercise Plan  AAROM and gentle neck stretches    Consulted and Agree with Plan of Care  Patient       Patient will benefit from skilled therapeutic intervention in order to improve the following deficits and impairments:  Decreased  activity tolerance, Decreased endurance, Decreased range of motion, Decreased strength, Hypomobility, Impaired flexibility, Pain, Postural dysfunction  Visit Diagnosis: Cervicalgia  DDD (degenerative disc  disease), cervical     Problem List Patient Active Problem List   Diagnosis Date Noted  . Hemorrhoids 06/13/2018  . Dizziness 06/13/2018   Lazarus Gowda, PT, DPT 09/21/18 10:37 AM  [facility] [address] [place], [place], [address] Phone: [phone]   Fax:  [phone]  Name: James Garner MRN: [id number] Date of Birth: 01/05/1976

## 2018-09-25 ENCOUNTER — Encounter: Payer: Medicaid Other | Admitting: Physical Therapy

## 2018-09-27 ENCOUNTER — Encounter: Payer: Self-pay | Admitting: Physical Therapy

## 2018-09-27 ENCOUNTER — Ambulatory Visit: Payer: Medicaid Other | Admitting: Physical Therapy

## 2018-09-27 DIAGNOSIS — M5030 Other cervical disc degeneration, unspecified cervical region: Secondary | ICD-10-CM | POA: Diagnosis not present

## 2018-09-27 DIAGNOSIS — M542 Cervicalgia: Secondary | ICD-10-CM

## 2018-09-27 NOTE — Therapy (Signed)
[place], [place], [address] Phone: [phone]   Fax:  [phone]  Physical Therapy Treatment  Patient Details  Name: James Garner MRN: [id number] Date of Birth: [birth year] Referring Provider (PT): Fenton Malling PA-C   Encounter Date: 09/27/2018  PT End of Session - 09/27/18 0900    Visit Number  3    Date for PT Re-Evaluation  10/17/18    Authorization Type  Medicaid-3 visits approved     Authorization Time Period  09/20/18-10/20/18    Authorization - Number of Visits  3    PT Start Time  0859   pt in late   PT Stop Time  0945    PT Time Calculation (min)  46 min    Activity Tolerance  Patient tolerated treatment well       Past Medical History:  Diagnosis Date  . Hemorrhoids     Past Surgical History:  Procedure Laterality Date  . HEMORRHOID SURGERY  05/2018    There were no vitals filed for this visit.  Subjective Assessment - 09/27/18 0901    Currently in Pain?  Yes    Pain Score  3    sometimes it goes up to 10/10   Pain Location  Neck    Pain Orientation  Right;Left    Pain Descriptors / Indicators  Aching    Pain Type  Chronic pain    Pain Onset  More than a month ago    Pain Frequency  Constant    Aggravating Factors   physical exercise, moving arms    Pain Relieving Factors  nothing                       [facility] Adult PT Treatment/Exercise - 09/27/18 0001      Exercises   Exercises  Neck      Neck Exercises: Seated   Other Seated Exercise  pulleys - shoulder flexion 1'      Modalities   Modalities  Electrical Stimulation;Moist Heat      Moist Heat Therapy   Number Minutes Moist Heat  15 Minutes    Moist Heat Location  Cervical      Electrical Stimulation   Electrical Stimulation Location  cervical    Electrical Stimulation Action  IFC    Electrical Stimulation Parameters  to tolerance    Electrical Stimulation Goals  Tone;Pain      Manual  Therapy   Manual Therapy  Soft tissue mobilization;Joint mobilization;Manual Traction    Joint Mobilization  prone CPA/bilat UPA mobs cervical and thoracic with focus at C2-5 CPA and Lt UPA     Soft tissue mobilization  prone STM to bilat cervical paraspinals and upper traps     Manual Traction  supine cervical       Neck Exercises: Stretches   Other Neck Stretches  supine over rolled up pillow with snow angels.                PT Short Term Goals - 09/05/18 1040      PT SHORT TERM GOAL #1   Title  Pt will be I and compliant with initial HEP. 3 weeks 09/26/18    Baseline  no HEP until today    Status  New      PT SHORT TERM GOAL #2   Title  Pt will report pain decrease by 1/10. 3 weeks 09/26/18    Baseline  9/10 overall with activity  Status  New        PT Long Term Goals - 09/05/18 1042      PT LONG TERM GOAL #1   Title  Pt will report less than 4/10 overall pain with ususal activity. 6 weeks 10/17/18    Baseline  9/10 overall    Status  New      PT LONG TERM GOAL #2   Title  Pt will improve cervical ROM to [facility] to improve function. 6 weeks 10/17/18    Baseline  50% ROM     Status  New      PT LONG TERM GOAL #3   Title  Pt will improve sleep quality to improve function. 6 weeks 10/17/18    Baseline  poor sleep quality due to 9/10 pain and discomfort    Status  New            Plan - 09/27/18 0937    Clinical Impression Statement  James Garner was in late, reported bad traffic.  He stated he hasn't felt any better with the current HEP and treatment.  Still having a lot of pain in his neck, feels like his spine is stuck.  He had pain with CPA mobs C2-5 and Lt UPA in same area, Is very hypomobile throughout the cervical and thoracic spine.  He also has palpable tightness in the cervical paraspinals and Lt upper trap/levator. He did report he felt like he could move better after treatment. No goals met, would benefit from continued spinal mobs and trigger point dry  needling.     Clinical Impairments Affecting Rehab Potential  chronic pain    PT Frequency  2x / week    PT Duration  6 weeks    PT Treatment/Interventions  Cryotherapy;Electrical Stimulation;Iontophoresis 70m/ml Dexamethasone;Moist Heat;Traction;Ultrasound;Therapeutic exercise;Therapeutic activities;Neuromuscular re-education;Manual techniques;Passive range of motion;Dry needling;Taping;Spinal Manipulations;Joint Manipulations    PT Next Visit Plan  DN cervical and upper traps, will need to request more visits with MCD    Consulted and Agree with Plan of Care  Patient       Patient will benefit from skilled therapeutic intervention in order to improve the following deficits and impairments:  Decreased activity tolerance, Decreased endurance, Decreased range of motion, Decreased strength, Hypomobility, Impaired flexibility, Pain, Postural dysfunction  Visit Diagnosis: Cervicalgia  DDD (degenerative disc disease), cervical     Problem List Patient Active Problem List   Diagnosis Date Noted  . Hemorrhoids 06/13/2018  . Dizziness 06/13/2018    SBoneta LucksrPT  09/27/2018, 12:40 PM  [facility] [address]Phone: [phone]  Fax:  [phone] Name: James WilliamsenMRN: [id number] of Birth: [birth year]

## 2018-10-02 ENCOUNTER — Encounter: Payer: Medicaid Other | Admitting: Physical Therapy

## 2018-10-04 ENCOUNTER — Encounter: Payer: Self-pay | Admitting: Physical Therapy

## 2018-10-04 ENCOUNTER — Ambulatory Visit: Payer: Medicaid Other | Admitting: Physical Therapy

## 2018-10-04 DIAGNOSIS — M542 Cervicalgia: Secondary | ICD-10-CM

## 2018-10-04 DIAGNOSIS — M5030 Other cervical disc degeneration, unspecified cervical region: Secondary | ICD-10-CM

## 2018-10-04 NOTE — Therapy (Signed)
[place], [place], [address] Phone: [phone]   Fax:  [phone]  Physical Therapy Treatment / Recertication  Patient Details  Name: James Garner MRN: [id number] Date of Birth: June 07, 1976 Referring Provider (PT): James Malling PA-C   Encounter Date: 10/04/2018  PT End of Session - 10/04/18 0935    Visit Number  4    Number of Visits  12    Date for PT Re-Evaluation  11/29/18    Authorization Type  Medicaid-3 visits approved     Authorization Time Period  09/20/18-10/20/18    Authorization - Visit Number  3    Authorization - Number of Visits  3    PT Start Time  0935    PT Stop Time  1015    PT Time Calculation (min)  40 min    Activity Tolerance  Patient tolerated treatment well    Behavior During Therapy  [facility] for tasks assessed/performed       Past Medical History:  Diagnosis Date  . Hemorrhoids     Past Surgical History:  Procedure Laterality Date  . HEMORRHOID SURGERY  05/2018    There were no vitals filed for this visit.  Subjective Assessment - 10/04/18 0938    Subjective  " I am doing about the same since starting physical therapy, I am doing the exercises but I still have that same sensation in the neck"     Currently in Pain?  Yes    Pain Score  3    at worst 10/10   Pain Location  Neck    Pain Orientation  Right;Left    Pain Descriptors / Indicators  --   had difficulty describe    Pain Type  Chronic pain    Pain Onset  More than a month ago    Pain Frequency  Constant    Aggravating Factors   any strenuous exercise/ lifting, head movements    Pain Relieving Factors  nothing makes it better         [facility] PT Assessment - 10/04/18 0942      AROM   Cervical Flexion  30   PDM   Cervical Extension  24    Cervical - Right Side Bend  18   ERP    Cervical - Left Side Bend  18   ERP   Cervical - Right Rotation  44    Cervical - Left Rotation  57      Palpation    Palpation comment  bil upper trap tightness with mulitple trigger points , and sub-occipital tightness                    [facility] Adult PT Treatment/Exercise - 10/04/18 0001      Self-Care   Self-Care  Other Self-Care Comments    Other Self-Care Comments   posture in regard to upper trap over activation and cues to keep shoulder down to reduce spasm/ tightness      Exercises   Exercises  Neck      Neck Exercises: Seated   Other Seated Exercise  upper cervical rotation with 3 fingers against the chest for tactile cues looking to the L/ R      Manual Therapy   Manual Therapy  Passive ROM    Manual therapy comments  MTPR along L upper trap/ levator scapulae x 2 ea.    Joint Mobilization  supine C3-C6, L gapping Grade 3 C3-C6, L first  rib mobs grade 3-4 with pt deep breathing to promote MWM    Passive ROM  gentle distraciton with cervical flexion working into L rotation gradually increasing ROM      Neck Exercises: Stretches   Upper Trapezius Stretch  2 reps;30 seconds             PT Education - 10/04/18 1046    Education Details  reviewed anatomy of the spine and reviewed Hep and updated for rib mobs and upper cervical rotation. benefits of good posture reducing upper trap activation. Discussed updated POC and if no more progress is made over the course of the next few weeks we will have to refer back to his MD.     Person(s) Educated  Patient    Methods  Explanation;Verbal cues;Handout;Demonstration    Comprehension  Verbalized understanding;Verbal cues required;Returned demonstration       PT Short Term Goals - 10/04/18 1021      PT SHORT TERM GOAL #1   Title  Pt will be I and compliant with initial HEP. 3 weeks 09/26/18    Period  Weeks    Status  Achieved      PT SHORT TERM GOAL #2   Title  Pt will report pain decrease by 1/10. 3 weeks 09/26/18    Baseline  report 6/10 pain    Period  Weeks    Status  Achieved        PT Long Term Goals - 10/04/18  1022      PT LONG TERM GOAL #1   Title  Pt will report less than 4/10 overall pain with ususal activity.     Baseline  constant 6/10 pain     Period  Weeks    Status  On-going    Target Date  11/01/18      PT LONG TERM GOAL #2   Title  Pt will improve cervical ROM to [facility] to improve function.     Baseline  limited sidebending and regression of the R  rotation, ~50% ROM    Time  4    Period  Weeks    Status  On-going    Target Date  11/01/18      PT LONG TERM GOAL #3   Title  Pt will improve sleep quality to improve function.    Baseline  continued difficulty with sleeping due to pain and discomfort at 6/10    Time  6    Period  Weeks    Status  On-going            Plan - 10/04/18 1038    Clinical Impression Statement  James Garner reports continued apin at 6/10 in the neck with L >R, with spasm noted in bil upper trap/ levator scapulae/ and sub-occipitals and demonstrates regression of ROM with bil sidebending and R rotation. He met all short term goals on this session and is making progress toward long term goals and is motivated to continue with treatment to improve function. continued cervical and L rib mobs. pt opted to hold off on Dry needling today, utilized MTPR techniques along the upper trap/ levator scapulae. he would benefit from continued phyiscal therapy to promote cervical mobility, reduce spasm, and promote overall function working toward remaining goals and independentl exercise.     Rehab Potential  Fair    PT Frequency  2x / week    PT Duration  4 weeks    PT Treatment/Interventions  Cryotherapy;Electrical Stimulation;Iontophoresis 6m/ml Dexamethasone;Moist Heat;Traction;Ultrasound;Therapeutic  exercise;Therapeutic activities;Neuromuscular re-education;Manual techniques;Passive range of motion;Dry needling;Taping;Spinal Manipulations;Joint Manipulations    PT Next Visit Plan  DN cervical and upper traps, posture education, cervical mobs, scapula setting    PT Home  Exercise Plan  AAROM and gentle neck stretches, rib mobs, upper cervical rotation.     Consulted and Agree with Plan of Care  Patient       Patient will benefit from skilled therapeutic intervention in order to improve the following deficits and impairments:  Decreased activity tolerance, Decreased endurance, Decreased range of motion, Decreased strength, Hypomobility, Impaired flexibility, Pain, Postural dysfunction  Visit Diagnosis: Cervicalgia  DDD (degenerative disc disease), cervical     Problem List Patient Active Problem List   Diagnosis Date Noted  . Hemorrhoids 06/13/2018  . Dizziness 06/13/2018   Starr Lake PT, DPT, LAT, ATC  10/04/18  10:59 AM      [facility] [address] [place], [place], [address] Phone: [phone]   Fax:  [phone]  Name: James Garner MRN: [id number] Date of Birth: 17-Dec-1975

## 2018-10-08 ENCOUNTER — Ambulatory Visit: Payer: Medicaid Other | Admitting: Physical Therapy

## 2018-10-09 ENCOUNTER — Encounter: Payer: Medicaid Other | Admitting: Physical Therapy

## 2018-10-11 ENCOUNTER — Encounter: Payer: Medicaid Other | Admitting: Physical Therapy

## 2018-10-12 ENCOUNTER — Ambulatory Visit: Payer: Medicaid Other | Attending: Physician Assistant

## 2018-10-12 DIAGNOSIS — M5030 Other cervical disc degeneration, unspecified cervical region: Secondary | ICD-10-CM | POA: Diagnosis not present

## 2018-10-12 DIAGNOSIS — M542 Cervicalgia: Secondary | ICD-10-CM | POA: Diagnosis not present

## 2018-10-12 DIAGNOSIS — H8112 Benign paroxysmal vertigo, left ear: Secondary | ICD-10-CM

## 2018-10-12 DIAGNOSIS — R42 Dizziness and giddiness: Secondary | ICD-10-CM
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
## 2018-10-16 ENCOUNTER — Ambulatory Visit: Payer: Medicaid Other | Admitting: Physical Therapy

## 2018-10-17 ENCOUNTER — Encounter: Payer: Medicaid Other | Admitting: Physical Therapy

## 2018-10-19 ENCOUNTER — Encounter: Payer: Medicaid Other | Admitting: Physical Therapy

## 2018-10-23 ENCOUNTER — Ambulatory Visit: Payer: Medicaid Other | Admitting: Physical Therapy

## 2018-10-23 ENCOUNTER — Encounter: Payer: Self-pay | Admitting: Physical Therapy

## 2018-10-23 DIAGNOSIS — M5030 Other cervical disc degeneration, unspecified cervical region: Secondary | ICD-10-CM

## 2018-10-23 DIAGNOSIS — R42 Dizziness and giddiness: Secondary | ICD-10-CM | POA: Diagnosis not present

## 2018-10-23 DIAGNOSIS — H8112 Benign paroxysmal vertigo, left ear: Secondary | ICD-10-CM | POA: Diagnosis not present

## 2018-10-23 DIAGNOSIS — M542 Cervicalgia: Secondary | ICD-10-CM | POA: Diagnosis not present

## 2018-10-23 NOTE — Therapy (Addendum)
[place], [place], [address] Phone: [phone]   Fax:  [phone]  Physical Therapy Treatment/Discharge Summary  Patient Details  Name: James Garner MRN: [id number] Date of Birth: 09-15-1976 Referring Provider (PT): Fenton Malling PA-C   Encounter Date: 10/23/2018  PT End of Session - 10/23/18 1057    Visit Number  6    Number of Visits  12    Date for PT Re-Evaluation  11/29/18    Authorization Type  Medicaid-3 visits approved, 8 additional visits approved 10/21/18-11/17/18 with Hinton Dyer approving vestibular re-eval today.    Authorization Time Period  10/21/18-11/17/18    Authorization - Visit Number  2    Authorization - Number of Visits  8    PT Start Time  0940    PT Stop Time  1029    PT Time Calculation (min)  49 min    Activity Tolerance  Patient tolerated treatment well    Behavior During Therapy  [facility] for tasks assessed/performed       Past Medical History:  Diagnosis Date  . Hemorrhoids     Past Surgical History:  Procedure Laterality Date  . HEMORRHOID SURGERY  05/2018    There were no vitals filed for this visit.  Subjective Assessment - 10/23/18 1053    Subjective  Pt. reports neck pain "terrible" over the past few days but not quite as bad this AM. He has started vestibular rehab for BPPV-still noting some tendency for vertigo with left sidelying positions.     Currently in Pain?  Yes    Pain Score  4     Pain Location  Neck    Pain Orientation  Right;Left;Upper    Pain Descriptors / Indicators  Aching    Pain Type  Chronic pain    Pain Radiating Towards  left>right upper trapezius and levator region    Pain Onset  More than a month ago    Pain Frequency  Constant    Aggravating Factors   ROM, prolonged postures    Pain Relieving Factors  no specific eases noted         [facility] PT Assessment - 10/23/18 0001      AROM   Cervical Flexion  36    Cervical Extension  40     Cervical - Right Side Bend  30    Cervical - Left Side Bend  28    Cervical - Right Rotation  47    Cervical - Left Rotation  64                   [facility] Adult PT Treatment/Exercise - 10/23/18 0001      Exercises   Exercises  Neck;Shoulder      Neck Exercises: Seated   Cervical Rotation  --   seated rotation upper cervical"SNAG" x 10 ea way with towel     Neck Exercises: Supine   Neck Retraction  20 reps    Other Supine Exercise  supine manual stretches bilat. upper trapezius and levator      Moist Heat Therapy   Number Minutes Moist Heat  10 Minutes    Moist Heat Location  Cervical      Manual Therapy   Manual Therapy  Joint mobilization;Soft tissue mobilization;Myofascial release;Manual Traction    Joint Mobilization  supine lateral glides C3-6 grade I-IIII    Soft tissue mobilization  STM bilat. upper  trapezius and levator left side focus  Myofascial Release  suboccipital release    Manual Traction  gentle cervical manual traction               PT Short Term Goals - 10/04/18 1021      PT SHORT TERM GOAL #1   Title  Pt will be I and compliant with initial HEP. 3 weeks 09/26/18    Period  Weeks    Status  Achieved      PT SHORT TERM GOAL #2   Title  Pt will report pain decrease by 1/10. 3 weeks 09/26/18    Baseline  report 6/10 pain    Period  Weeks    Status  Achieved        PT Long Term Goals - 10/12/18 1102      PT LONG TERM GOAL #1   Title  Pt will report less than 4/10 overall pain with ususal activity.     Baseline  constant 6/10 pain     Period  Weeks    Status  On-going      PT LONG TERM GOAL #2   Title  Pt will improve cervical ROM to [facility] to improve function.     Baseline  limited sidebending and regression of the R  rotation, ~50% ROM    Time  4    Period  Weeks    Status  On-going      PT LONG TERM GOAL #3   Title  Pt will improve sleep quality to improve function.    Baseline  continued difficulty with sleeping due  to pain and discomfort at 6/10    Time  6    Period  Weeks    Status  On-going      PT LONG TERM GOAL #4   Title  Neuro: Pt will report 0/10 during all positional testing to decr. dizziness, improve QOL, and improve safety during ADLs. (assess after 8 visits)    Status  New      PT LONG TERM GOAL #5   Title  Neuro: Perform FGA and write goal as indicated, once dizziness resolved. (assess after 8 visits)    Status  New            Plan - 10/23/18 1058    Clinical Impression Statement  Fair progress with (neck) therapy with continued muscle tension and pain. Pt. has made gains with cervical ROM from previous status but still with tightness. No current radicular symptoms.    PT Frequency  2x / week    PT Duration  4 weeks    PT Treatment/Interventions  Cryotherapy;Electrical Stimulation;Iontophoresis 29m/ml Dexamethasone;Moist Heat;Traction;Ultrasound;Therapeutic exercise;Therapeutic activities;Neuromuscular re-education;Manual techniques;Passive range of motion;Dry needling;Taping;Spinal Manipulations;Joint Manipulations;Vestibular;Canalith Repostioning    PT Next Visit Plan  DN cervical and upper traps, posture education, cervical mobs, scapula setting. Neuro: reassess for L pBPPV, assess for B horizontal BPPV as tolerated and assess balance once vertigo resolved.     PT Home Exercise Plan  AAROM and gentle neck stretches, rib mobs, upper cervical rotation.     Consulted and Agree with Plan of Care  Patient       Patient will benefit from skilled therapeutic intervention in order to improve the following deficits and impairments:  Decreased activity tolerance, Decreased endurance, Decreased range of motion, Decreased strength, Hypomobility, Impaired flexibility, Pain, Postural dysfunction, Dizziness  Visit Diagnosis: Cervicalgia  DDD (degenerative disc disease), cervical     Problem List Patient Active Problem List   Diagnosis Date Noted  .  Hemorrhoids 06/13/2018  .  Dizziness 06/13/2018      PHYSICAL THERAPY DISCHARGE SUMMARY  Visits from Start of Care: 6  Current functional level related to goals / functional outcomes: Status unknown-patient did not return for further therapy after last visit 10/23/18.   Remaining deficits: Unknown   Education / Equipment: NA Plan:                                                    Patient goals were not met. Patient is being discharged due to not returning since the last visit.  ?????           Beaulah Dinning, PT, DPT 12/31/18 1:09 PM          [facility] [address] [place], [place], [address] Phone: [phone]   Fax:  [phone]  Name: James Garner MRN: [id number] Date of Birth: 1976-05-17

## 2018-10-26 ENCOUNTER — Encounter: Payer: Medicaid Other | Admitting: Physical Therapy

## 2018-11-02 ENCOUNTER — Ambulatory Visit: Payer: Medicaid Other

## 2018-11-16 ENCOUNTER — Ambulatory Visit: Payer: Medicaid Other | Attending: Physician Assistant

## 2018-11-30 ENCOUNTER — Ambulatory Visit: Payer: Medicaid Other

## 2019-02-13 ENCOUNTER — Encounter: Payer: Self-pay | Admitting: Physician Assistant

## 2019-02-13 ENCOUNTER — Ambulatory Visit: Payer: Medicaid Other | Admitting: Physician Assistant

## 2019-02-13 VITALS — BP 144/89 | HR 80 | Temp 98.3°F | Resp 16 | Wt 137.0 lb

## 2019-02-13 DIAGNOSIS — M5412 Radiculopathy, cervical region: Secondary | ICD-10-CM

## 2019-02-13 DIAGNOSIS — M5030 Other cervical disc degeneration, unspecified cervical region: Secondary | ICD-10-CM | POA: Diagnosis not present

## 2019-02-13 DIAGNOSIS — M2570 Osteophyte, unspecified joint: Secondary | ICD-10-CM | POA: Diagnosis not present

## 2019-02-13 MED ORDER — MELOXICAM 15 MG PO TABS: 15.0000 mg | ORAL_TABLET | Freq: Every day | ORAL | 0 refills | Status: DC

## 2019-02-13 NOTE — Progress Notes (Signed)
Patient: James Garner Male    DOB: 25-Oct-1976   43 y.o.   MRN: [id number] Visit Date: 02/13/2019  Today's Provider: Margaretann Loveless, PA-C   Chief Complaint  Patient presents with  . Follow-up   Subjective:     HPI  Follow up for Degenerative disc disease, cervical  The patient was last seen for this 6 months ago. Changes made at last visit include start PT.  He reports excellent compliance with treatment.  He feels that condition is Unchanged. He is not having side effects.   He completed PT without relief. He has used medrol dose pak as well without relief. He had been prescribed meloxicam without relief. States neck hurts all the time no matter position. Does report still having numbness and tingling in both arms. No weakness.  -----------------------------------------------------------------------------------   No Known Allergies   Current Outpatient Medications:  .  meloxicam (MOBIC) 7.5 MG tablet, Take 1 tablet (7.5 mg total) by mouth daily. (Patient not taking: Reported on 09/05/2018), Disp: 30 tablet, Rfl: 0 .  methylPREDNISolone (MEDROL) 4 MG TBPK tablet, 6 day taper; take as directed on package instructions (Patient not taking: Reported on 09/05/2018), Disp: 21 tablet, Rfl: 0  Review of Systems  Constitutional: Negative.   Respiratory: Negative.   Cardiovascular: Negative.   Musculoskeletal: Positive for myalgias, neck pain and neck stiffness.  Neurological: Negative.     Social History   Tobacco Use  . Smoking status: Never Smoker  . Smokeless tobacco: Never Used  Substance Use Topics  . Alcohol use: Yes    Comment: 2-3 times a year       Objective:   BP (!) 144/89 (BP Location: Left Arm, Patient Position: Sitting, Cuff Size: Normal)   Pulse 80   Temp 98.3 F (36.8 C) (Oral)   Resp 16   Wt 137 lb (62.1 kg)   BMI 21.46 kg/m  Vitals:   02/13/19 0936  BP: (!) 144/89  Pulse: 80  Resp: 16  Temp: 98.3 F (36.8 C)  TempSrc: Oral    Weight: 137 lb (62.1 kg)     Physical Exam Vitals signs reviewed.  Constitutional:      General: He is not in acute distress.    Appearance: Normal appearance. He is well-developed and normal weight. He is not ill-appearing or diaphoretic.  HENT:     Head: Normocephalic and atraumatic.  Neck:     Musculoskeletal: Neck supple. Decreased range of motion. Muscular tenderness present. No spinous process tenderness.     Thyroid: No thyromegaly.     Vascular: No JVD.     Trachea: No tracheal deviation.  Cardiovascular:     Rate and Rhythm: Normal rate and regular rhythm.     Heart sounds: Normal heart sounds. No murmur. No friction rub. No gallop.   Pulmonary:     Effort: Pulmonary effort is normal. No respiratory distress.     Breath sounds: Normal breath sounds. No wheezing or rales.  Lymphadenopathy:     Cervical: No cervical adenopathy.  Neurological:     Mental Status: He is alert.        Assessment & Plan    1. DDD (degenerative disc disease), cervical Has failed conservative therapy with medrol, meloxicam, IBU, tylenol, and PT. Will continue meloxicam for now. MRI ordered for further evaluation.  - MR Cervical Spine Wo Contrast; Future - meloxicam (MOBIC) 15 MG tablet; Take 1 tablet (15 mg total) by mouth daily.  Dispense: 30  tablet; Refill: 0     Mar Daring, PA-C  [facility]

## 2019-02-13 NOTE — Patient Instructions (Signed)
Degenerative Disk Disease  Degenerative disk disease is a condition caused by changes that occur in the spinal disks as a person ages. Spinal disks are soft and compressible disks located between the bones of your spine (vertebrae). These disks act like shock absorbers. Degenerative disk disease can affect the whole spine. However, the neck and lower back are most often affected. Many changes can occur in the spinal disks with aging, such as:  The spinal disks may dry and shrink.  Small tears may occur in the tough, outer covering of the disk (annulus).  The disk space may become smaller due to loss of water.  Abnormal growths in the bone (spurs) may occur. This can put pressure on the nerve roots exiting the spinal canal, causing pain.  The spinal canal may become narrowed. What are the causes? This condition may be caused by:  Normal degeneration with age.  Injuries.  Certain activities and sports that cause damage. What increases the risk? The following factors may make you more likely to develop this condition:  Being overweight.  Having a family history of degenerative disk disease.  Smoking.  Sudden injury.  Doing work that requires heavy lifting. What are the signs or symptoms? Symptoms of this condition include:  Pain that varies in intensity. Some people have no pain, while others have severe pain. The location of the pain depends on the part of your backbone that is affected. You may have: ? Pain in your neck or arm if a disk in your neck area is affected. ? Pain in your back, buttocks, or legs if a disk in your lower back is affected.  Pain that becomes worse while bending or reaching up, or with twisting movements.  Pain that may start gradually and then get worse as time passes. It may also start after a major or minor injury.  Numbness or tingling in the arms or legs. How is this diagnosed? This condition may be diagnosed based on:  Your symptoms and  medical history.  A physical exam.  Imaging tests, including: ? An X-ray of the spine. ? MRI. How is this treated? This condition may be treated with:  Medicines.  Rehabilitation exercises. These activities aim to strengthen muscles in your back and abdomen to better support your spine. If treatments do not help to relieve your symptoms or you have severe pain, you may need surgery. Follow these instructions at home: Medicines  Take over-the-counter and prescription medicines only as told by your health care provider.  Do not drive or use heavy machinery while taking prescription pain medicine.  If you are taking prescription pain medicine, take actions to prevent or treat constipation. Your health care provider may recommend that you: ? Drink enough fluid to keep your urine pale yellow. ? Eat foods that are high in fiber, such as fresh fruits and vegetables, whole grains, and beans. ? Limit foods that are high in fat and processed sugars, such as fried or sweet foods. ? Take an over-the-counter or prescription medicine for constipation. Activity  Rest as told by your health care provider.  Ask your health care provider what activities are safe for you. Return to your normal activities as directed.  Avoid sitting for a long time without moving. Get up to take short walks every 1-2 hours. This is important to improve blood flow and breathing. Ask for help if you feel weak or unsteady.  Perform relaxation exercises as told by your health care provider.  Maintain good posture.    Do not lift anything that is heavier than 10 lb (4.5 kg), or the limit that you are told, until your health care provider says that it is safe.  Follow proper lifting and walking techniques as told by your health care provider. Managing pain, stiffness, and swelling   If directed, put ice on the painful area. Icing can help to relieve pain. ? Put ice in a plastic bag. ? Place a towel between your  skin and the bag. ? Leave the ice on for 20 minutes, 2-3 times a day.  If directed, apply heat to the painful area as often as told by your health care provider. Heat can reduce the stiffness of your muscles. Use the heat source that your health care provider recommends, such as a moist heat pack or a heating pad. ? Place a towel between your skin and the heat source. ? Leave the heat on for 20-30 minutes. ? Remove the heat if your skin turns bright red. This is especially important if you are unable to feel pain, heat, or cold. You may have a greater risk of getting burned. General instructions  Change your sitting, standing, and sleeping habits as told by your health care provider.  Avoid sitting in the same position for long periods of time. Change positions frequently.  Lose weight or maintain a healthy weight as told by your health care provider.  Do not use any products that contain nicotine or tobacco, such as cigarettes and e-cigarettes. If you need help quitting, ask your health care provider.  Wear supportive footwear.  Keep all follow-up visits as told by your health care provider. This is important. This may include visits for physical therapy. Contact a health care provider if you:  Have pain that does not go away within 1-4 weeks.  Lose your appetite.  Lose weight without trying. Get help right away if you:  Have severe pain.  Notice weakness in your arms, hands, or legs.  Begin to lose control of your bladder or bowel movements.  Have fevers or night sweats. Summary  Degenerative disk disease is a condition caused by changes that occur in the spinal disks as a person ages.  Degenerative disk disease can affect the whole spine. However, the neck and lower back are most often affected.  Take over-the-counter and prescription medicines only as told by your health care provider. This information is not intended to replace advice given to you by your health care  provider. Make sure you discuss any questions you have with your health care provider. Document Released: 09/25/2007 Document Revised: 11/23/2017 Document Reviewed: 11/23/2017 Elsevier Interactive Patient Education  2019 Elsevier Inc.  

## 2019-02-22 ENCOUNTER — Ambulatory Visit
Admission: RE | Admit: 2019-02-22 | Discharge: 2019-02-22 | Disposition: A | Discharge status: Home / Self Care | Payer: Medicaid Other | Source: Ambulatory Visit | Attending: Physician Assistant | Admitting: Physician Assistant

## 2019-02-22 ENCOUNTER — Other Ambulatory Visit: Payer: Self-pay

## 2019-02-22 ENCOUNTER — Telehealth: Payer: Self-pay

## 2019-02-22 DIAGNOSIS — M5030 Other cervical disc degeneration, unspecified cervical region: Secondary | ICD-10-CM | POA: Insufficient documentation

## 2019-02-22 DIAGNOSIS — M542 Cervicalgia: Secondary | ICD-10-CM | POA: Diagnosis not present

## 2019-02-22 NOTE — Addendum Note (Signed)
Addended by: Margaretann Loveless on: 02/22/2019 04:48 PM   Modules accepted: Orders

## 2019-02-22 NOTE — Telephone Encounter (Signed)
-----   Message from Margaretann Loveless, [place] sent at 02/22/2019  4:46 PM EDT ----- Cervical MRI shows some disc bulge and osteophytes from C4-C7. Will place referral to neurosurgeon for further evaluation.

## 2019-02-22 NOTE — Telephone Encounter (Signed)
Patient advised as below.  

## 2019-04-11 DIAGNOSIS — M542 Cervicalgia: Secondary | ICD-10-CM | POA: Diagnosis not present

## 2019-04-11 DIAGNOSIS — M62838 Other muscle spasm: Secondary | ICD-10-CM | POA: Diagnosis not present

## 2019-04-11 DIAGNOSIS — M47812 Spondylosis without myelopathy or radiculopathy, cervical region: Secondary | ICD-10-CM | POA: Diagnosis not present

## 2019-04-22 DIAGNOSIS — M25512 Pain in left shoulder: Secondary | ICD-10-CM | POA: Diagnosis not present

## 2019-04-22 DIAGNOSIS — M542 Cervicalgia: Secondary | ICD-10-CM | POA: Diagnosis not present

## 2019-04-29 DIAGNOSIS — M542 Cervicalgia: Secondary | ICD-10-CM | POA: Diagnosis not present

## 2019-05-02 DIAGNOSIS — M542 Cervicalgia: Secondary | ICD-10-CM | POA: Diagnosis not present

## 2019-05-07 DIAGNOSIS — M542 Cervicalgia: Secondary | ICD-10-CM | POA: Diagnosis not present

## 2019-05-09 DIAGNOSIS — M542 Cervicalgia: Secondary | ICD-10-CM | POA: Diagnosis not present

## 2019-05-20 DIAGNOSIS — M542 Cervicalgia: Secondary | ICD-10-CM | POA: Diagnosis not present

## 2019-05-23 DIAGNOSIS — M542 Cervicalgia: Secondary | ICD-10-CM | POA: Diagnosis not present

## 2019-05-31 DIAGNOSIS — M542 Cervicalgia: Secondary | ICD-10-CM | POA: Diagnosis not present

## 2019-06-03 ENCOUNTER — Telehealth: Payer: Self-pay | Admitting: Physician Assistant

## 2019-06-03 NOTE — Telephone Encounter (Signed)
Pt calling regarding [place] the therapist there suggested for pt to be prescribed the 10s therapy machine at home myofacial neck pain   Please call pt at [phone] - leave a message to say who you are and the pt will pick up the call.  Thanks, [employer]

## 2019-06-05 DIAGNOSIS — M542 Cervicalgia: Secondary | ICD-10-CM | POA: Diagnosis not present

## 2019-06-07 NOTE — Telephone Encounter (Signed)
I can write a prescription and we can fax it to his home health store of choice. Where would he like it sent?

## 2019-06-10 NOTE — Telephone Encounter (Signed)
Prescription signed. Please fax along with 02/2019 visit note with [facility] in [place].

## 2019-06-10 NOTE — Telephone Encounter (Signed)
faxed

## 2019-06-10 NOTE — Telephone Encounter (Signed)
He said any home health will do in [place]

## 2019-06-13 DIAGNOSIS — M542 Cervicalgia: Secondary | ICD-10-CM | POA: Diagnosis not present

## 2019-06-18 DIAGNOSIS — M542 Cervicalgia: Secondary | ICD-10-CM | POA: Diagnosis not present

## 2019-06-26 DIAGNOSIS — M542 Cervicalgia: Secondary | ICD-10-CM | POA: Diagnosis not present

## 2019-06-28 ENCOUNTER — Telehealth: Payer: Self-pay

## 2019-06-28 DIAGNOSIS — M542 Cervicalgia: Secondary | ICD-10-CM | POA: Diagnosis not present

## 2019-06-28 NOTE — Telephone Encounter (Signed)
Yes I agree. Since he has been doing PT and still having pain, he may need to f/u with neurosurgery for further evaluation or consider pain management if he does not want surgery.

## 2019-06-28 NOTE — Telephone Encounter (Signed)
Patient states that he is still having a lot of pain and it is keeping him up throughout the night. Patient states that he will come in for a office visit if needs too. Patient states that he still have not heard anything about his therapy machine yet and states that the best number for him to be reached at is [phone]. Please advise.

## 2019-06-28 NOTE — Telephone Encounter (Signed)
Can we check with [facility] in [place]? That's where machine was sent. He has tried multiple treatments with [place] and consulted on by neurosurgery, who recommended physical therapy a trigger point injections. Since his pain has continued, he will likely have to pursue that. I will also copy jenni for her input on this because I have not seen this patient for these issues or in the past year.

## 2019-06-28 NOTE — Telephone Encounter (Signed)
Please advise 

## 2019-07-03 DIAGNOSIS — M47812 Spondylosis without myelopathy or radiculopathy, cervical region: Secondary | ICD-10-CM | POA: Diagnosis not present

## 2019-07-03 DIAGNOSIS — M62838 Other muscle spasm: Secondary | ICD-10-CM | POA: Diagnosis not present

## 2019-07-03 NOTE — Telephone Encounter (Signed)
[facility] 

## 2019-08-12 DIAGNOSIS — M47812 Spondylosis without myelopathy or radiculopathy, cervical region: Secondary | ICD-10-CM | POA: Diagnosis not present

## 2019-08-12 DIAGNOSIS — M62838 Other muscle spasm: Secondary | ICD-10-CM | POA: Diagnosis not present

## 2019-12-03 DIAGNOSIS — M5382 Other specified dorsopathies, cervical region: Secondary | ICD-10-CM | POA: Diagnosis not present

## 2019-12-03 DIAGNOSIS — M47812 Spondylosis without myelopathy or radiculopathy, cervical region: Secondary | ICD-10-CM | POA: Diagnosis not present

## 2019-12-03 DIAGNOSIS — M62838 Other muscle spasm: Secondary | ICD-10-CM | POA: Diagnosis not present

## 2019-12-16 NOTE — Progress Notes (Deleted)
       Patient: James Garner    DOB: [birth year]   44 y.o.   MRN: [id number] Visit Date: 12/16/2019  Today's Provider: Trey Sailors, PA-C   No chief complaint on file.  Subjective:     Neck Pain       No Known Allergies   Current Outpatient Medications:  .  meloxicam (MOBIC) 15 MG tablet, Take 1 tablet (15 mg total) by mouth daily., Disp: 30 tablet, Rfl: 0  Review of Systems  Musculoskeletal: Positive for neck pain.    Social History   Tobacco Use  . Smoking status: Never Smoker  . Smokeless tobacco: Never Used  Substance Use Topics  . Alcohol use: Yes    Comment: 2-3 times a year       Objective:   There were no vitals taken for this visit. There were no vitals filed for this visit.There is no height or weight on file to calculate BMI.   Physical Exam   No results found for any visits on 12/17/19.     Assessment & Plan        Trey Sailors, PA-C  [facility]

## 2019-12-17 ENCOUNTER — Ambulatory Visit: Payer: Medicaid Other | Admitting: Physician Assistant

## 2019-12-18 ENCOUNTER — Ambulatory Visit: Payer: Medicaid Other | Admitting: Physician Assistant

## 2019-12-18 NOTE — Progress Notes (Deleted)
       Patient: Jerrie Gullo Male    DOB: 04/27/1976   44 y.o.   MRN: [id number] Visit Date: 12/18/2019  Today's Provider: Trey Sailors, PA-C   No chief complaint on file.  Subjective:     HPI  No Known Allergies   Current Outpatient Medications:  .  meloxicam (MOBIC) 15 MG tablet, Take 1 tablet (15 mg total) by mouth daily., Disp: 30 tablet, Rfl: 0  Review of Systems  Constitutional: Negative.   Respiratory: Negative.   Cardiovascular: Negative.   Gastrointestinal: Negative.   Musculoskeletal: Negative.   Neurological: Negative.     Social History   Tobacco Use  . Smoking status: Never Smoker  . Smokeless tobacco: Never Used  Substance Use Topics  . Alcohol use: Yes    Comment: 2-3 times a year       Objective:   There were no vitals taken for this visit. There were no vitals filed for this visit.There is no height or weight on file to calculate BMI.   Physical Exam   No results found for any visits on 12/18/19.     Assessment & Plan        Trey Sailors, PA-C  [facility]

## 2020-01-02 ENCOUNTER — Encounter: Payer: Self-pay | Admitting: Physician Assistant

## 2020-01-02 ENCOUNTER — Other Ambulatory Visit: Payer: Self-pay

## 2020-01-02 ENCOUNTER — Ambulatory Visit: Payer: Medicaid Other | Admitting: Physician Assistant

## 2020-01-02 VITALS — BP 124/86 | HR 68 | Temp 96.9°F | Wt 134.4 lb

## 2020-01-02 DIAGNOSIS — B009 Herpesviral infection, unspecified: Secondary | ICD-10-CM

## 2020-01-02 MED ORDER — VALACYCLOVIR HCL 1 G PO TABS: 1000.0000 mg | ORAL_TABLET | Freq: Three times a day (TID) | ORAL | 0 refills | Status: DC

## 2020-01-02 NOTE — Patient Instructions (Signed)
Oral Mucositis Oral mucositis is a mouth condition that may develop as a result of treatments for cancer. Sores may appear on the lips, gums, tongue, throat, and the top (roof) or bottom (floor) of the mouth. What are the causes? This condition is caused when cancer treatments damage the lining of the mouth. This condition can happen to anyone who is being treated with cancer therapies, including:  Cancer medicines (chemotherapy) or targeted therapy.  Radiation therapy.  Bone marrow transplants and stem cell transplants. Oral mucositis is not caused by infection. However, the sores can become infected after they form. Infection can make oral mucositis worse. What increases the risk? The following factors may make you more likely to develop this condition:  Having cancers that primarily affect the blood, head, or neck.  Receiving radiation therapy alone, or in combination with chemotherapy, to the head and neck region.  Undergoing high dose chemotherapy alone or as part of bone marrow or stem cell transplant. In addition, there are other risks, including:  Having poor oral hygiene, dental problems or oral diseases.  Wearing dentures that do not fit correctly.  Having other medical conditions, such as diabetes, HIV, AIDS, or kidney disease.  Using products that contain nicotine or tobacco, such as cigarettes, chewing tobacco, and e-cigarettes.  Not drinking enough clear fluids.  Drinking alcohol. What are the signs or symptoms? Symptoms of this condition include:  Mouth sores. These sores may bleed.  Color changes inside the mouth. Red, shiny areas may appear.  White patches or pus in the mouth.  Pain in the mouth and throat. This can make it painful to speak and swallow.  Dryness and a burning feeling in the mouth.  Saliva that is thick.  Trouble eating, drinking, and swallowing. This can lead to weight loss. Symptoms of this condition can vary from mild to severe.  Symptoms are usually seen 7-10 days after cancer treatment has started. How is this diagnosed? This condition can be diagnosed with a physical exam. In some cases, lab tests or cultures may be done to check for an associated infection. How is this treated? Treatment depends on the severity of the condition. Oral mucositis often heals on its own. Sometimes, changes in the cancer treatment can help. Treatment may include medicines or therapies, such as:  An antibiotic medicine to fight infection.  Medicine or therapies that help the cells in your mouth heal more quickly.  Chewing on ice before treatment to help prevent mucositis. Medicine may also be given to help control pain. This may include:  Pain relievers that are swished around in the mouth (topical anesthetics). These make the mouth numb to ease the pain.  Mouth rinses.  Prescribed, medicated gels. The gel coats the mouth. This protects nerve endings and lessens the pain.  Pain medicines. Follow these instructions at home: Medicines  Take or apply over-the-counter and prescription medicines only as told by your health care provider.  If you were prescribed an antibiotic medicine, take or apply it as told by your health care provider. Do not stop using the antibiotic even if you start to feel better.  Do not use products that contain benzocaine, including numbing gels, to treat mouth pain in children who are younger than 2 years. These products may cause a rare but serious blood condition. Eating and drinking   Talk to a diet and nutrition specialist (dietitian) about what you should eat and drink if you have mucositis.  Drink high-nutrition and high-calorie shakes or supplements.    Eat bland and soft foods that are easy to eat.  Drink enough fluid to keep your urine pale yellow.  Do not eat foods that are hot, spicy, citrus, or hard to swallow.  Do not drink alcohol.  Try sucking on ice chips or sugar-free frozen pops.  This may help with pain. This also keeps your mouth moist. Lifestyle      Keep your mouth clean and germ-free. To maintain good oral hygiene: ? Brush your teeth carefully with a soft toothbrush at least two times each day. Use a gentle toothpaste. Ask your health care provider to recommend the right toothpaste for you. ? Use a soft sponge (oral swab) to clean your mouth and teeth instead of a toothbrush if mouth sores are severe. ? Floss your teeth every day. ? Have your teeth cleaned regularly as recommended by your dentist. ? Rinse your mouth after every meal or as directed by your health care provider. Do not use mouthwash that contains alcohol. Ask your health care provider for a mouthwash or mouth rinse recommendation.  Do not use any products that contain nicotine or tobacco, such as cigarettes, e-cigarettes, and chewing tobacco. If you need help quitting, ask your health care provider. General instructions  Follow instructions from your health care provider about: ? Cleaning mouth sores. ? Taking out your dentures. ? Changing your diet or finding other ways to get nutrients. This is important if you are losing weight.  If your lips are dry or cracked, apply a water-based moisturizer to your lips as needed.  Keep all follow-up visits as told by your health care provider. This is important. Contact a health care provider if:  You have mouth pain or throat pain.  Your symptoms get worse.  You have new symptoms.  Your pain is not controlled with medicine.  You have trouble speaking. Get help right away if you:  Have a fever.  Cannot swallow solid food or liquids.  Have a lot of bleeding in your mouth.  Develop new, open, or draining sores in your mouth. Summary  Oral mucositis is a mouth condition that may develop as a result of treatments for cancer.  Sores may appear on your lips, gums, tongue, throat, and the top (roof) or bottom (floor) of your mouth.  Cancer  treatments can damage the lining of the mouth, which causes this condition.  Treatment depends on how severe the condition is. It may include medicine or therapies to fight infection, medicine to ease pain, or medicine to help the cells in your mouth heal more quickly. This information is not intended to replace advice given to you by your health care provider. Make sure you discuss any questions you have with your health care provider. Document Revised: 07/08/2019 Document Reviewed: 07/08/2019 Elsevier Patient Education  2020 Elsevier Inc.  

## 2020-01-02 NOTE — Progress Notes (Signed)
       Patient: James Garner Male    DOB: 22-Sep-1976   44 y.o.   MRN: [id number] Visit Date: 01/02/2020  Today's Provider: Trey Sailors, PA-C   Chief Complaint  Patient presents with  . Follow-up   Subjective:     HPI  Follow-up Patient presents today for 2 bumps under his tongue for about 1 week now. Patient states that it has white color and has been sore. Denies swollen glands, bleeding. Reports some burning sensation.    No Known Allergies   Current Outpatient Medications:  .  meloxicam (MOBIC) 15 MG tablet, Take 1 tablet (15 mg total) by mouth daily. (Patient not taking: Reported on 01/02/2020), Disp: 30 tablet, Rfl: 0  Review of Systems  Social History   Tobacco Use  . Smoking status: Never Smoker  . Smokeless tobacco: Never Used  Substance Use Topics  . Alcohol use: Yes    Comment: 2-3 times a year       Objective:   BP 124/86 (BP Location: Left Arm, Patient Position: Sitting, Cuff Size: Normal)   Pulse 68   Temp (!) 96.9 F (36.1 C) (Temporal)   Wt 134 lb 6.4 oz (61 kg)   BMI 21.05 kg/m  Vitals:   01/02/20 1439  BP: 124/86  Pulse: 68  Temp: (!) 96.9 F (36.1 C)  TempSrc: Temporal  Weight: 134 lb 6.4 oz (61 kg)  Body mass index is 21.05 kg/m.   Physical Exam Constitutional:      Appearance: Normal appearance.  HENT:     Mouth/Throat:   Skin:    General: Skin is warm and dry.  Neurological:     General: No focal deficit present.     Mental Status: He is alert and oriented to person, place, and time.      No results found for any visits on 01/02/20.     Assessment & Plan    1. 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 We are out of culture swabs today. Will treat empirically.   - valACYclovir (VALTREX) 1000 MG tablet; Take 1 tablet (1,000 mg total) by mouth 3 (three) times daily.  Dispense: 21 tablet; Refill: 0  The entirety of the information documented in the History of Present Illness, Review of Systems and Physical Exam were  personally obtained by me. Portions of this information were initially documented by [facility] and reviewed by me for thoroughness and accuracy.      Trey Sailors, PA-C  [facility]

## 2020-01-17 ENCOUNTER — Telehealth: Payer: Self-pay

## 2020-01-17 DIAGNOSIS — B009 Herpesviral infection, unspecified: Secondary | ICD-10-CM

## 2020-01-17 NOTE — Telephone Encounter (Signed)
Copied from CRM [id number]. Topic: General - Other >> Jan 17, 2020  2:53 PM Jaquita Rector A wrote: Reason for CRM: Patient called to inform James Garner that the issue he was seen for on 01/02/20 cleared up after taking the medication but now it seem to be coming back. Patient would like either a refill on the mediication or something else and would like a call back also today if possible please. Ph#  [phone]

## 2020-01-20 MED ORDER — VALACYCLOVIR HCL 1 G PO TABS: 1000.0000 mg | ORAL_TABLET | Freq: Three times a day (TID) | ORAL | 3 refills | Status: DC

## 2020-01-20 NOTE — Telephone Encounter (Signed)
Patient was advised.  

## 2020-01-20 NOTE — Telephone Encounter (Signed)
Valtrex refilled and additional refills placed. If it becomes recurrent, we may need to consider having him taking valtrex 1000 mg daily to prevent it.

## 2020-02-15 DIAGNOSIS — Z20828 Contact with and (suspected) exposure to other viral communicable diseases: Secondary | ICD-10-CM | POA: Diagnosis not present

## 2020-04-08 ENCOUNTER — Telehealth: Payer: Self-pay | Admitting: Physician Assistant

## 2020-04-08 NOTE — Telephone Encounter (Signed)
It was an ultrasound. And yes, as the visit was two years ago he would have to be seen again.

## 2020-04-08 NOTE — Telephone Encounter (Signed)
Pt stated he is still having pain with his testicles. Stated he saw Ricki Rodriguez about this a while ago and was set up to get an MRI but was scared to get it. He would like to know if he can have the MRI rescheduled or does he need to see Ricki Rodriguez again first. Please advise.

## 2020-04-08 NOTE — Telephone Encounter (Signed)
Patient advised. Appointment scheduled.  

## 2020-04-09 ENCOUNTER — Other Ambulatory Visit: Payer: Self-pay

## 2020-04-09 ENCOUNTER — Ambulatory Visit (INDEPENDENT_AMBULATORY_CARE_PROVIDER_SITE_OTHER): Payer: Medicaid Other | Admitting: Physician Assistant

## 2020-04-09 ENCOUNTER — Encounter: Payer: Self-pay | Admitting: Physician Assistant

## 2020-04-09 VITALS — BP 134/76 | HR 78 | Temp 97.3°F | Wt 150.0 lb

## 2020-04-09 DIAGNOSIS — N5089 Other specified disorders of the male genital organs: Secondary | ICD-10-CM

## 2020-04-09 NOTE — Progress Notes (Signed)
     Established patient visit   Patient: James Garner   DOB: 1976-10-14   44 y.o. Male  MRN: [id number] Visit Date: 04/09/2020  Today's healthcare provider: Trey Sailors, PA-C   Chief Complaint  Patient presents with  . Testicle Pain   Velora Mediate as a scribe for Trey Sailors, PA-C.,have documented all relevant documentation on the behalf of Trey Sailors, PA-C,as directed by  Trey Sailors, PA-C while in the presence of Trey Sailors, PA-C.  Subjective    Testicle Pain The patient's primary symptoms include pelvic pain and testicular pain. This is a new problem. Episode onset: started at age 80  The problem occurs intermittently. Pertinent negatives include no abdominal pain, frequency, hematuria, hesitancy or painful intercourse. The testicular pain affects the left testicle. The color of the testicles is normal. Treatments tried: massages. The treatment provided mild relief.  Denies fevers, chills, nausea, vomiting, severe testicular pain, dysuria.       Medications: Outpatient Medications Prior to Visit  Medication Sig  . valACYclovir (VALTREX) 1000 MG tablet Take 1 tablet (1,000 mg total) by mouth 3 (three) times daily.  . [DISCONTINUED] meloxicam (MOBIC) 15 MG tablet Take 1 tablet (15 mg total) by mouth daily. (Patient not taking: Reported on 01/02/2020)   No facility-administered medications prior to visit.    Review of Systems  Constitutional: Negative.   Respiratory: Negative.   Cardiovascular: Negative.   Gastrointestinal: Negative for abdominal pain.  Genitourinary: Positive for pelvic pain and testicular pain. Negative for frequency and hesitancy.  Musculoskeletal: Negative.       Objective    BP 134/76 (BP Location: Right Arm, Patient Position: Sitting, Cuff Size: Normal)   Pulse 78   Temp (!) 97.3 F (36.3 C) (Temporal)   Wt 150 lb (68 kg)   BMI 23.49 kg/m    Physical Exam Constitutional:      Appearance: Normal  appearance. He is normal weight.  Cardiovascular:     Rate and Rhythm: Normal rate and regular rhythm.     Pulses: Normal pulses.     Heart sounds: Normal heart sounds.  Pulmonary:     Effort: Pulmonary effort is normal.     Breath sounds: Normal breath sounds.  Abdominal:     General: Bowel sounds are normal.     Palpations: Abdomen is soft.  Genitourinary:    Comments: Small discrete lesion on left testicle Skin:    General: Skin is warm and dry.  Neurological:     General: No focal deficit present.     Mental Status: He is alert and oriented to person, place, and time.  Psychiatric:        Mood and Affect: Mood normal.        Behavior: Behavior normal.     No results found for any visits on 04/09/20.  Assessment & Plan    1. Testicular mass Follow up with [place] possible cyst. May need to follow up with Urology. Scheduled for COVID vaccine.   - US SCROTUM W/DOPPLER; Future    Return if symptoms worsen or fail to improve.      ITrey Sailors, PA-C, have reviewed all documentation for this visit. The documentation on 04/09/20 for the exam, diagnosis, procedures, and orders are all accurate and complete.    Maryella Shivers  [facility] [phone] (phone) [phone] (fax)  [facility]

## 2020-04-11 ENCOUNTER — Ambulatory Visit: Payer: Medicaid Other

## 2020-04-16 ENCOUNTER — Ambulatory Visit
Admission: RE | Admit: 2020-04-16 | Discharge: 2020-04-16 | Disposition: A | Discharge status: Home / Self Care | Payer: Medicaid Other | Source: Ambulatory Visit | Attending: Physician Assistant | Admitting: Physician Assistant

## 2020-04-16 ENCOUNTER — Other Ambulatory Visit: Payer: Self-pay

## 2020-04-16 DIAGNOSIS — N503 Cyst of epididymis: Secondary | ICD-10-CM | POA: Diagnosis not present

## 2020-04-16 DIAGNOSIS — N5089 Other specified disorders of the male genital organs: Secondary | ICD-10-CM

## 2020-04-20 ENCOUNTER — Telehealth: Payer: Self-pay

## 2020-04-20 DIAGNOSIS — N5089 Other specified disorders of the male genital organs: Secondary | ICD-10-CM

## 2020-04-20 NOTE — Telephone Encounter (Signed)
Copied from CRM [id number]. Topic: Referral - Request for Referral >> Apr 20, 2020 12:44 PM Reggie Pile, [place] wrote: Has patient seen PCP for this complaint? yes *If NO, is insurance requiring patient see PCP for this issue before PCP can refer them? Referral for which specialty: urologist  Preferred provider/office: anyone within area Reason for referral: cyst from ultrasound

## 2020-04-21 NOTE — Addendum Note (Signed)
Addended by: Trey Sailors on: 04/21/2020 08:29 AM   Modules accepted: Orders

## 2020-04-21 NOTE — Telephone Encounter (Signed)
Patient was advised.  

## 2020-04-21 NOTE — Telephone Encounter (Signed)
Referral placed.

## 2020-04-23 NOTE — Progress Notes (Incomplete)
04/24/20 10:35 PM   James Garner 1976/06/10 [id number]  Referring provider: Trinna Post, PA-C [address] [place],  [place] [address] No chief complaint on file.   HPI: James Garner is a 44 y.o. M who presents today for the evaluation and management of a testicular mass.   Visited PCP on 04/09/20 c/o intermittent pelvic and left testicular pain which started when he was 44 yo. Mild relief w/ massaging.   F/u US scrotum w/ doppler revealed a small cyst Garner head of left epididymis 10 mm in diameter.     PMH: Past Medical History:  Diagnosis Date   Hemorrhoids     Surgical History: Past Surgical History:  Procedure Laterality Date   HEMORRHOID SURGERY  05/2018    Home Medications:  Allergies as of 04/24/2020   No Known Allergies     Medication List       Accurate as of Apr 23, 2020 10:35 PM. If you have any questions, ask your nurse or doctor.        valACYclovir 1000 MG tablet Commonly known as: VALTREX Take 1 tablet (1,000 mg total) by mouth 3 (three) times daily.       Allergies: No Known Allergies  Family History: Family History  Adopted: Yes  Family history unknown: Yes    Social History:  reports that he has never smoked. He has never used smokeless tobacco. He reports current alcohol use. He reports previous drug use. Drug: Marijuana.   Physical Exam: There were no vitals taken for this visit.  Constitutional:  Alert and oriented, No acute distress. HEENT: [place], moist mucus membranes.  Trachea midline, no masses. Cardiovascular: No clubbing, cyanosis, or edema. Respiratory: Normal respiratory effort, no increased work of breathing. GI: Abdomen is soft, nontender, nondistended, no abdominal masses GU: No CVA tenderness Lymph: No cervical or inguinal lymphadenopathy. Skin: No rashes, bruises or suspicious lesions. Neurologic: Grossly intact, no focal deficits, moving all 4 extremities. Psychiatric: Normal mood and  affect.  Laboratory Data:  Urinalysis   Pertinent Imaging: CLINICAL DATA:  LEFT testicular and groin pain for 2 years  EXAM: SCROTAL ULTRASOUND  DOPPLER ULTRASOUND OF THE TESTICLES  TECHNIQUE: Complete ultrasound examination of the testicles, epididymis, and other scrotal structures was performed. Color and spectral Doppler ultrasound were also utilized to evaluate blood flow to the testicles.  COMPARISON:  None  FINDINGS: Right testicle  Measurements: 4.6 x 2.1 x 3.2 cm. Normal echogenicity without mass or calcification. Internal blood flow present on color Doppler imaging.  Left testicle  Measurements: 4.1 x 1.9 x 2.8 cm. Normal echogenicity without mass or calcification. Internal blood flow present on color Doppler imaging, symmetric with RIGHT  Right epididymis:  Normal in size and appearance.  Left epididymis: Small simple cyst Garner head of LEFT epididymis 10 x 7 x 7 mm.  Hydrocele:  None visualized.  Varicocele:  None visualized.  Pulsed Doppler interrogation of both testes demonstrates normal low resistance arterial and venous waveforms bilaterally.  Patient indicates the pain is slightly lateral and superior into LEFT groin; no sonographic abnormalities identified Garner the LEFT inguinal canal.  IMPRESSION: Small cyst Garner head of LEFT epididymis 10 mm diameter.  Otherwise normal exam.   Electronically Signed   By: Lavonia Dana M.D.   On: 04/16/2020 13:15  I have personally reviewed the images and agree with radiologist interpretation.   Assessment & Plan:     No follow-ups on file.  [place] [address]  [address], [address] [place], [place] [address] [phone]  I, James Garner, am acting as a scribe for Dr. Vanna Scotland,  {Add Scribe Attestation Statement}

## 2020-04-24 ENCOUNTER — Encounter: Payer: Self-pay | Admitting: Urology

## 2020-04-24 ENCOUNTER — Ambulatory Visit (INDEPENDENT_AMBULATORY_CARE_PROVIDER_SITE_OTHER): Payer: Medicaid Other | Admitting: Urology

## 2020-04-24 ENCOUNTER — Other Ambulatory Visit: Payer: Self-pay

## 2020-04-24 ENCOUNTER — Ambulatory Visit: Payer: Medicaid Other | Admitting: Urology

## 2020-04-24 VITALS — BP 110/77 | HR 69 | Ht 60.5 in | Wt 146.0 lb

## 2020-04-24 DIAGNOSIS — N503 Cyst of epididymis: Secondary | ICD-10-CM

## 2020-04-24 DIAGNOSIS — N50819 Testicular pain, unspecified: Secondary | ICD-10-CM | POA: Diagnosis not present

## 2020-04-24 DIAGNOSIS — G8929 Other chronic pain: Secondary | ICD-10-CM | POA: Diagnosis not present

## 2020-04-24 DIAGNOSIS — N50812 Left testicular pain: Secondary | ICD-10-CM

## 2020-04-24 NOTE — Progress Notes (Signed)
04/24/20 3:10 PM   James Garner 1976/06/15 [id number]  Referring provider: Trinna Post, PA-C [address] [place],  [place] [address] Chief Complaint  Patient presents with  . New Patient (Initial Visit)    testicular mass    HPI: James Garner is a 44 y.o. M who presents today for the evaluation and management of a testicular mass.   Visited PCP on 04/09/20 c/o intermittent pelvic and left testicular pain which started when he was 44 yo. Mild relief w/ massaging.   F/u US scrotum w/ doppler revealed a small cyst at head of left epididymis 10 mm in diameter.   He reports of intermittent cramp like sensation in left testicle which worsens w/ movement onset many years. He states of episodes w/ intervals of 6 months and sometimes twice in 1 month.  He notes of having some relief w/ massaging testicle.    He notices symptoms of post void dribbling. He reports of eating healthy and does not exercise.   He denies hx of bulging or hernia.   PMH: Past Medical History:  Diagnosis Date  . Hemorrhoids     Surgical History: Past Surgical History:  Procedure Laterality Date  . HEMORRHOID SURGERY  05/2018    Home Medications:  Allergies as of 04/24/2020   No Known Allergies     Medication List       Accurate as of Apr 24, 2020 11:59 PM. If you have any questions, ask your nurse or doctor.        STOP taking these medications   valACYclovir 1000 MG tablet Commonly known as: VALTREX Stopped by: Hollice Espy, MD       Allergies: No Known Allergies  Family History: Family History  Adopted: Yes  Family history unknown: Yes    Social History:  reports that he has never smoked. He has never used smokeless tobacco. He reports current alcohol use. He reports previous drug use. Drug: Marijuana.  Physical Exam: BP 110/77   Pulse 69   Ht 5' 0.5" (1.537 m)   Wt 146 lb (66.2 kg)   BMI 28.04 kg/m   Constitutional:  Alert and oriented, No acute  distress. HEENT: [place] AT, moist mucus membranes.  Trachea midline, no masses. Cardiovascular: No clubbing, cyanosis, or edema. Respiratory: Normal respiratory effort, no increased work of breathing. GU: No CVA tenderness. No hernia.  Skin: No rashes, bruises or suspicious lesions. Neurologic: Grossly intact, no focal deficits, moving all 4 extremities. Psychiatric: Normal mood and affect.  Pertinent Imaging: CLINICAL DATA:  LEFT testicular and groin pain for 2 years  EXAM: SCROTAL ULTRASOUND  DOPPLER ULTRASOUND OF THE TESTICLES  TECHNIQUE: Complete ultrasound examination of the testicles, epididymis, and other scrotal structures was performed. Color and spectral Doppler ultrasound were also utilized to evaluate blood flow to the testicles.  COMPARISON:  None  FINDINGS: Right testicle  Measurements: 4.6 x 2.1 x 3.2 cm. Normal echogenicity without mass or calcification. Internal blood flow present on color Doppler imaging.  Left testicle  Measurements: 4.1 x 1.9 x 2.8 cm. Normal echogenicity without mass or calcification. Internal blood flow present on color Doppler imaging, symmetric with RIGHT  Right epididymis:  Normal in size and appearance.  Left epididymis: Small simple cyst at head of LEFT epididymis 10 x 7 x 7 mm.  Hydrocele:  None visualized.  Varicocele:  None visualized.  Pulsed Doppler interrogation of both testes demonstrates normal low resistance arterial and venous waveforms bilaterally.  Patient indicates the pain  is slightly lateral and superior into LEFT groin; no sonographic abnormalities identified at the LEFT inguinal canal.  IMPRESSION: Small cyst at head of LEFT epididymis 10 mm diameter.  Otherwise normal exam.   Electronically Signed   By: Ulyses Southward M.D.   On: 04/16/2020 13:15  I have personally reviewed the images and agree with radiologist interpretation.   Assessment & Plan:    1. Chronic Left  inguinal/testicular pain Recommended NSAID use upon flares and scrotal support  If severity of pain increases, will consider local kenalog/lidocaine injection Physiologic pain located over the left inguinal ring without palpable hernia, suspect nerve hypersensitivity versus minor muscular tear  2. Epididymal cyst  Benign findings on PE  Unlikely source of pain   [facility] [address] [place], [place] [address] [phone]  I, Donne Hazel, am acting as a scribe for Dr. Vanna Scotland,  I have reviewed the above documentation for accuracy and completeness, and I agree with the above.   Vanna Scotland, MD

## 2020-08-07 ENCOUNTER — Telehealth: Payer: Self-pay

## 2020-08-07 DIAGNOSIS — B009 Herpesviral infection, unspecified: Secondary | ICD-10-CM

## 2020-08-07 MED ORDER — FAMCICLOVIR 500 MG PO TABS: 500.0000 mg | ORAL_TABLET | Freq: Three times a day (TID) | ORAL | 0 refills | Status: AC

## 2020-08-07 NOTE — Telephone Encounter (Signed)
Copied from CRM [id number]. Topic: General - Other >> Aug 07, 2020 11:07 AM Gwenlyn Fudge wrote: Reason for CRM: Pt called stating that he is breaking out on his tongue and is requesting to have medication sent in. Pt refused appt. Please advise.

## 2020-08-07 NOTE — Telephone Encounter (Signed)
Ok to send Famciclovir 500mg  [id number] [id number] r0

## 2020-08-07 NOTE — Telephone Encounter (Signed)
Medication was sent into the pharmacy.  

## 2020-08-07 NOTE — Telephone Encounter (Signed)
Please review.Ok to send?

## 2020-08-07 NOTE — Telephone Encounter (Signed)
Copied from CRM [id number]. Topic: General - Other >> Aug 07, 2020 11:13 AM Jaquita Rector A wrote: Reason for CRM: Patient called to inform Osvaldo Angst that the shingles have come back and needing medication he is asking can he be prescribed Famciclovir instead of valACYclovir (VALTREX) 1000 MG tablet. Stated that when he took Famciclovir before the symptoms went away and did not come back for a very long time. Ph# [phone]

## 2021-04-20 IMAGING — US US SCROTUM W/ DOPPLER COMPLETE
1 series · 13 of 25 positions shown · non-contrast
Comparison: None

CLINICAL DATA: LEFT testicular and groin pain for 2 years

EXAM:
SCROTAL ULTRASOUND
DOPPLER ULTRASOUND OF THE TESTICLES
TECHNIQUE: Complete ultrasound examination of the testicles, epididymis, and
other scrotal structures was performed. Color and spectral Doppler
ultrasound were also utilized to evaluate blood flow to the
testicles.

[Series 1: us scrotum w/ doppler complete · 0.07mm/px · 13 of 49 slices shown]
[im 1/49]
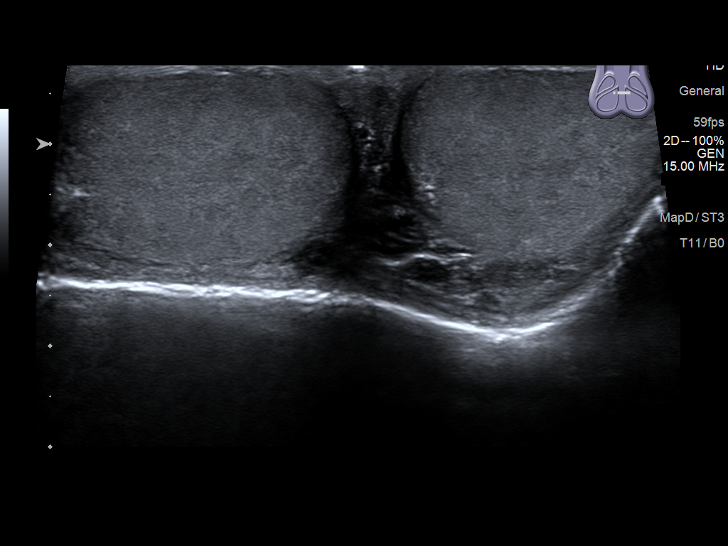
[im 5/49]
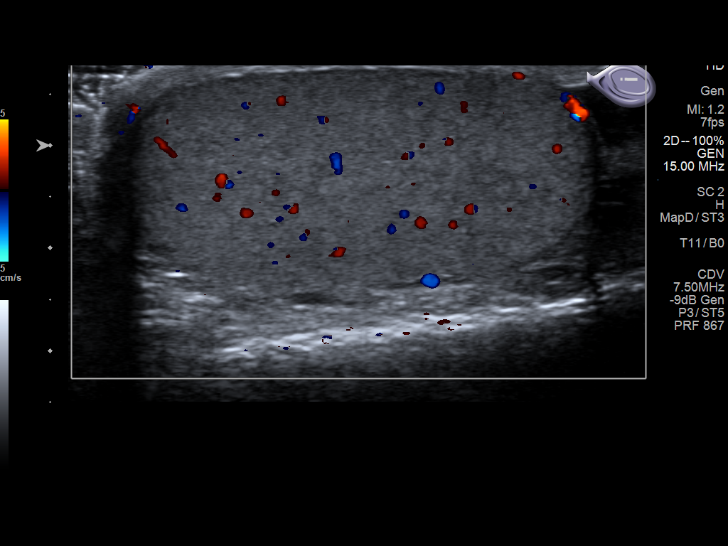
[im 9/49]
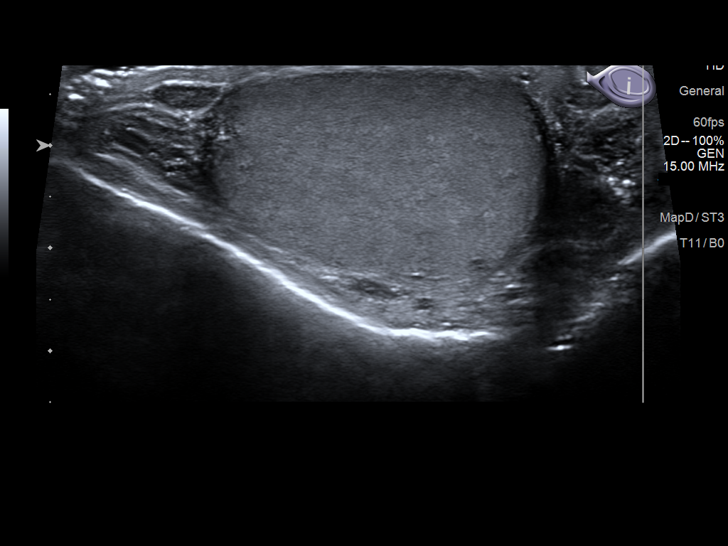
[im 13/49]
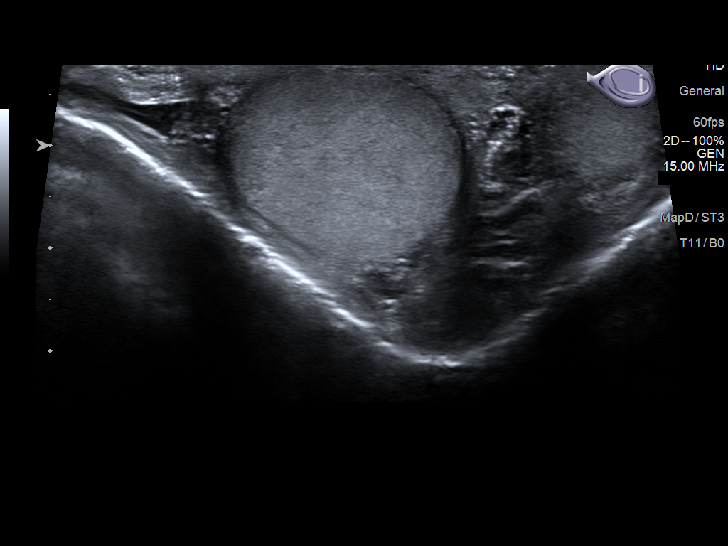
[im 17/49]
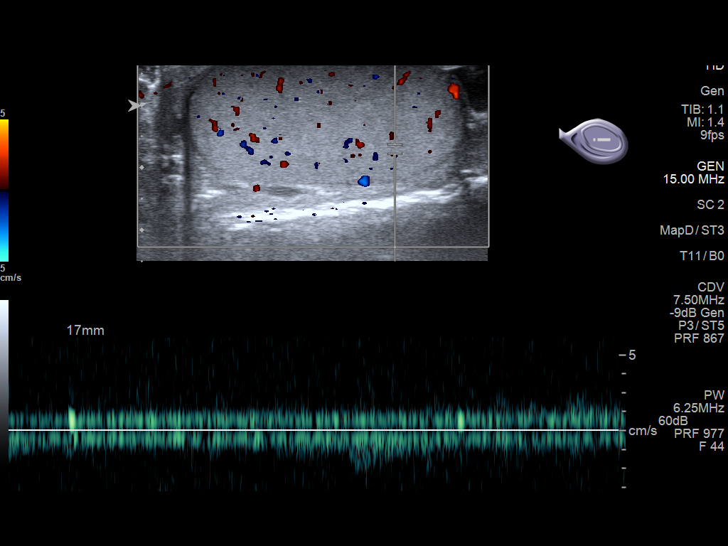
[im 21/49]
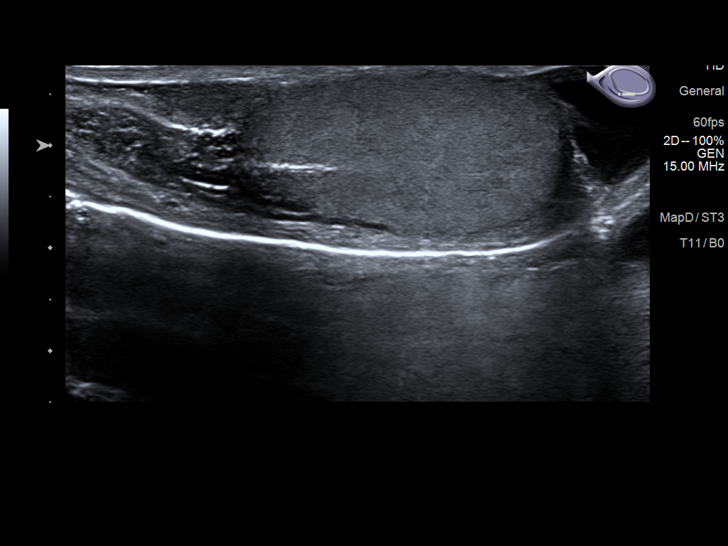
[im 25/49]
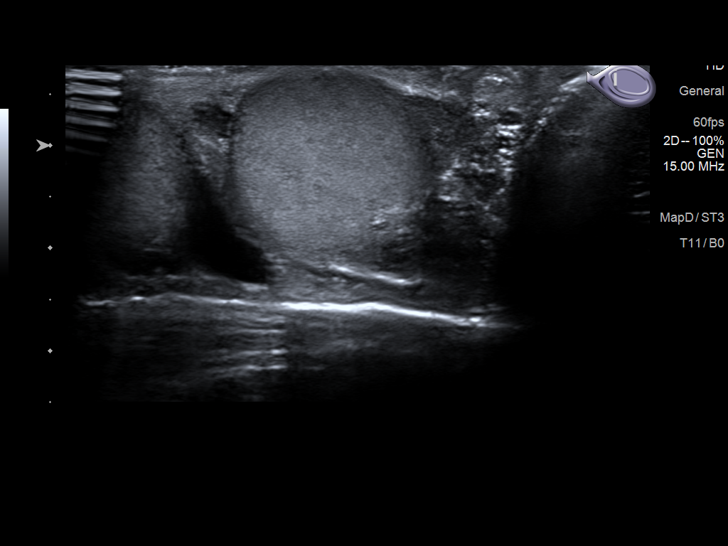
[im 29/49]
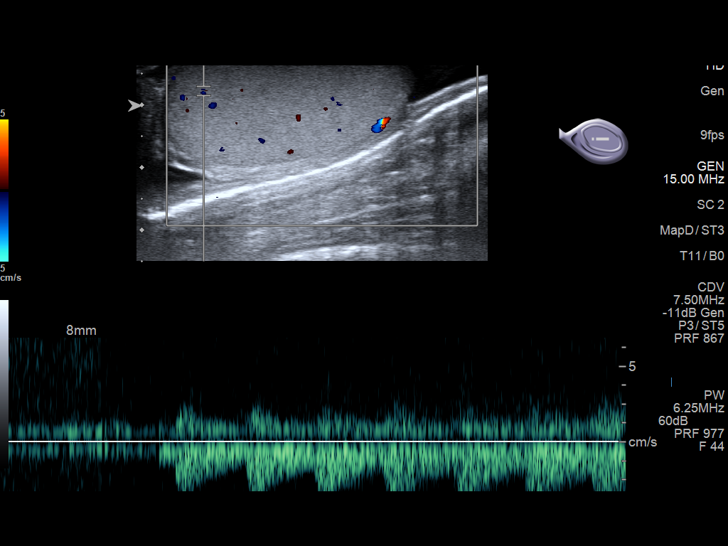
[im 33/49]
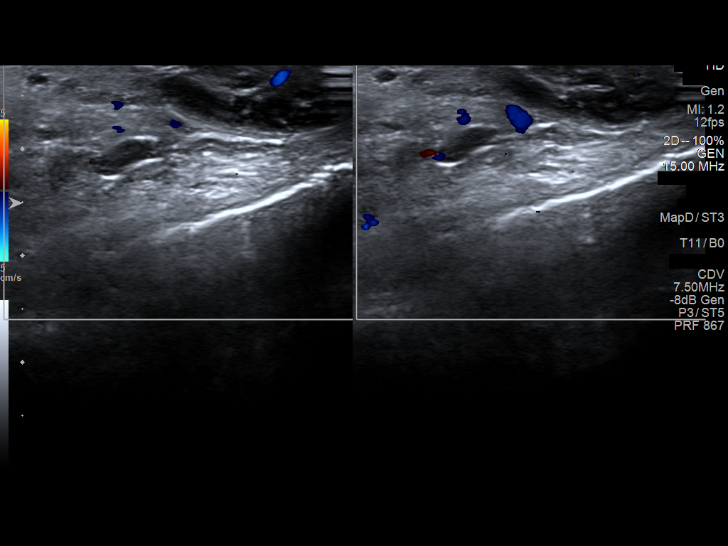
[im 37/49]
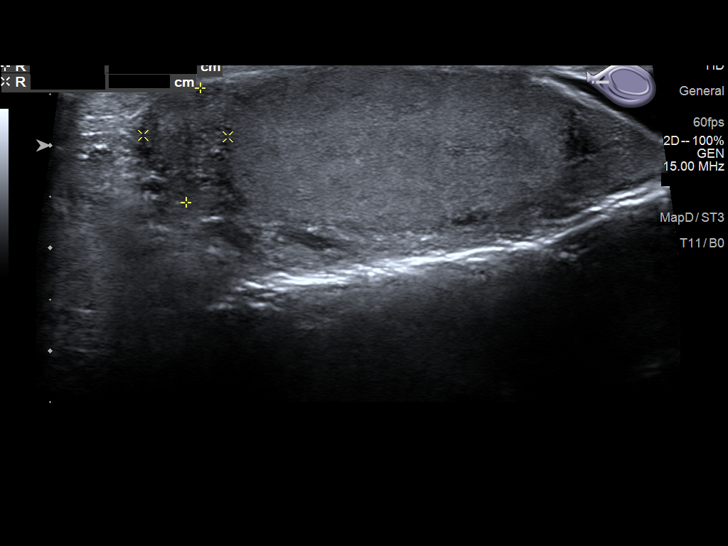
[im 41/49]
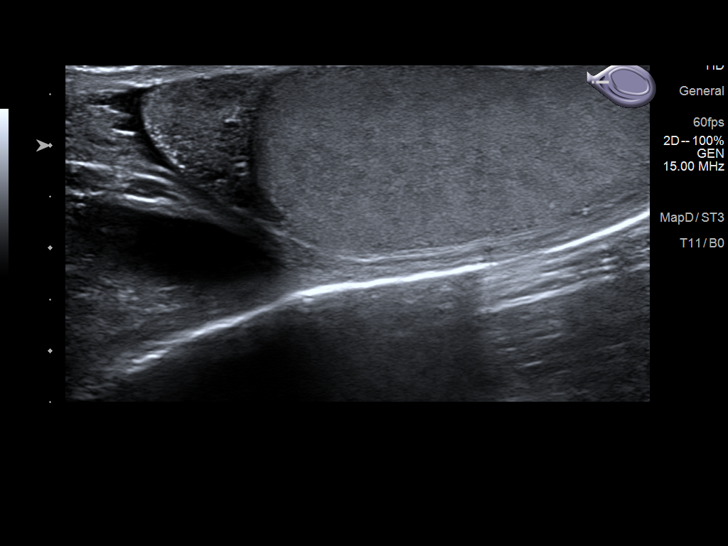
[im 45/49]
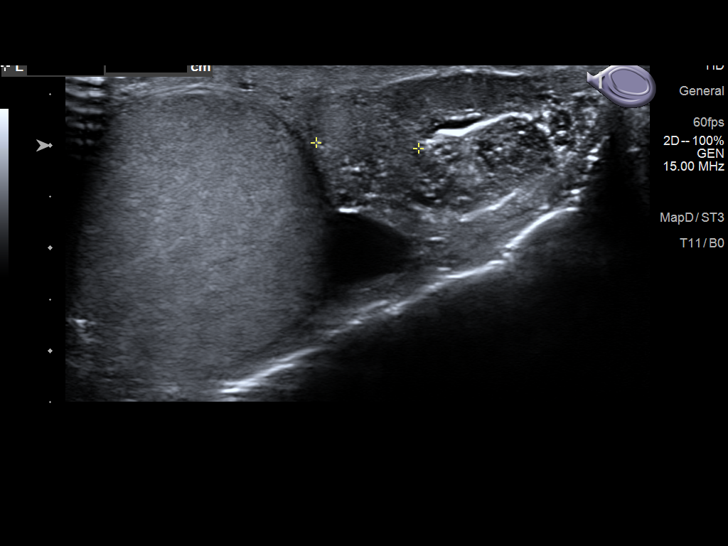
[im 49/49]
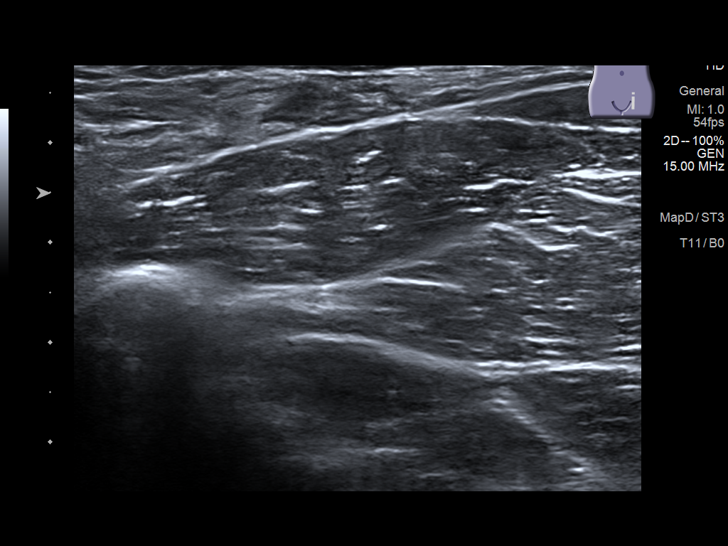

[13 of 25 positions shown; findings below may reference images not displayed]

FINDINGS: Right testicle

Measurements: 4.6 x 2.1 x 3.2 cm. Normal echogenicity without mass
or calcification. Internal blood flow present on color Doppler
imaging.

Left testicle

Measurements: 4.1 x 1.9 x 2.8 cm. Normal echogenicity without mass
or calcification. Internal blood flow present on color Doppler
imaging, symmetric with RIGHT

Right epididymis:  Normal in size and appearance.

Left epididymis: Small simple cyst at head of LEFT epididymis 10 x 7
x 7 mm.

Hydrocele:  None visualized.

Varicocele:  None visualized.

Pulsed Doppler interrogation of both testes demonstrates normal low
resistance arterial and venous waveforms bilaterally.

Patient indicates the pain is slightly lateral and superior into
LEFT groin; no sonographic abnormalities identified at the LEFT
inguinal canal.
IMPRESSION: Small cyst at head of LEFT epididymis 10 mm diameter.

Otherwise normal exam.

## 2022-08-06 DIAGNOSIS — M542 Cervicalgia: Secondary | ICD-10-CM | POA: Diagnosis not present

## 2022-08-08 ENCOUNTER — Emergency Department (HOSPITAL_COMMUNITY): Payer: Medicaid Other

## 2022-08-08 ENCOUNTER — Encounter (HOSPITAL_COMMUNITY): Payer: Self-pay | Admitting: Emergency Medicine

## 2022-08-08 ENCOUNTER — Emergency Department (HOSPITAL_COMMUNITY)
Admission: EM | Admit: 2022-08-08 | Discharge: 2022-08-08 | Payer: Medicaid Other | Attending: Emergency Medicine | Admitting: Emergency Medicine

## 2022-08-08 DIAGNOSIS — S92015A Nondisplaced fracture of body of left calcaneus, initial encounter for closed fracture: Secondary | ICD-10-CM | POA: Diagnosis not present

## 2022-08-08 DIAGNOSIS — S0990XA Unspecified injury of head, initial encounter: Secondary | ICD-10-CM | POA: Diagnosis not present

## 2022-08-08 DIAGNOSIS — T1490XA Injury, unspecified, initial encounter: Secondary | ICD-10-CM | POA: Diagnosis not present

## 2022-08-08 DIAGNOSIS — S92001A Unspecified fracture of right calcaneus, initial encounter for closed fracture: Secondary | ICD-10-CM | POA: Diagnosis not present

## 2022-08-08 DIAGNOSIS — S32018A Other fracture of first lumbar vertebra, initial encounter for closed fracture: Secondary | ICD-10-CM | POA: Diagnosis not present

## 2022-08-08 DIAGNOSIS — M7989 Other specified soft tissue disorders: Secondary | ICD-10-CM | POA: Diagnosis not present

## 2022-08-08 DIAGNOSIS — R69 Illness, unspecified: Secondary | ICD-10-CM | POA: Diagnosis not present

## 2022-08-08 DIAGNOSIS — W19XXXA Unspecified fall, initial encounter: Secondary | ICD-10-CM | POA: Diagnosis not present

## 2022-08-08 DIAGNOSIS — Y92240 Courthouse as the place of occurrence of the external cause: Secondary | ICD-10-CM | POA: Diagnosis not present

## 2022-08-08 DIAGNOSIS — S92014A Nondisplaced fracture of body of right calcaneus, initial encounter for closed fracture: Secondary | ICD-10-CM | POA: Diagnosis not present

## 2022-08-08 DIAGNOSIS — S92011A Displaced fracture of body of right calcaneus, initial encounter for closed fracture: Secondary | ICD-10-CM | POA: Insufficient documentation

## 2022-08-08 DIAGNOSIS — S299XXA Unspecified injury of thorax, initial encounter: Secondary | ICD-10-CM | POA: Diagnosis not present

## 2022-08-08 DIAGNOSIS — S32010A Wedge compression fracture of first lumbar vertebra, initial encounter for closed fracture: Secondary | ICD-10-CM | POA: Diagnosis not present

## 2022-08-08 DIAGNOSIS — Y9339 Activity, other involving climbing, rappelling and jumping off: Secondary | ICD-10-CM | POA: Insufficient documentation

## 2022-08-08 DIAGNOSIS — S92002A Unspecified fracture of left calcaneus, initial encounter for closed fracture: Secondary | ICD-10-CM | POA: Diagnosis not present

## 2022-08-08 DIAGNOSIS — S32020A Wedge compression fracture of second lumbar vertebra, initial encounter for closed fracture: Secondary | ICD-10-CM

## 2022-08-08 DIAGNOSIS — W1789XA Other fall from one level to another, initial encounter: Secondary | ICD-10-CM | POA: Diagnosis not present

## 2022-08-08 DIAGNOSIS — R102 Pelvic and perineal pain: Secondary | ICD-10-CM | POA: Diagnosis not present

## 2022-08-08 DIAGNOSIS — S32028A Other fracture of second lumbar vertebra, initial encounter for closed fracture: Secondary | ICD-10-CM | POA: Diagnosis not present

## 2022-08-08 DIAGNOSIS — S3993XA Unspecified injury of pelvis, initial encounter: Secondary | ICD-10-CM | POA: Diagnosis not present

## 2022-08-08 DIAGNOSIS — S3992XA Unspecified injury of lower back, initial encounter: Secondary | ICD-10-CM | POA: Diagnosis present

## 2022-08-08 MED ORDER — ACETAMINOPHEN 325 MG PO TABS: 650.0000 mg | ORAL_TABLET | Freq: Four times a day (QID) | ORAL | 0 refills | Status: AC

## 2022-08-08 MED ORDER — OXYCODONE-ACETAMINOPHEN 5-325 MG PO TABS
1.0000 | ORAL_TABLET | Freq: Once | ORAL | Status: AC
  Administered 2022-08-08: 1 via ORAL
  Filled 2022-08-08: qty 1

## 2022-08-08 MED ORDER — OXYCODONE-ACETAMINOPHEN 5-325 MG PO TABS
2.0000 | ORAL_TABLET | Freq: Once | ORAL | Status: AC
  Administered 2022-08-08: 2 via ORAL
  Filled 2022-08-08: qty 2

## 2022-08-08 MED ORDER — LIDOCAINE 5 % EX PTCH
1.0000 | MEDICATED_PATCH | CUTANEOUS | Status: DC
  Administered 2022-08-08: 1 via TRANSDERMAL
  Filled 2022-08-08: qty 1

## 2022-08-08 MED ORDER — METHOCARBAMOL 500 MG PO TABS: 500.0000 mg | ORAL_TABLET | Freq: Three times a day (TID) | ORAL | 0 refills | Status: AC | PRN

## 2022-08-08 MED ORDER — IBUPROFEN 600 MG PO TABS: 600.0000 mg | ORAL_TABLET | Freq: Four times a day (QID) | ORAL | 0 refills | Status: AC

## 2022-08-08 MED ORDER — LIDOCAINE 5 % EX PTCH
1.0000 | MEDICATED_PATCH | CUTANEOUS | 0 refills | Status: AC
Start: 2022-08-08 — End: 2022-08-22

## 2022-08-08 MED ORDER — METHOCARBAMOL 500 MG PO TABS
500.0000 mg | ORAL_TABLET | Freq: Once | ORAL | Status: AC
  Administered 2022-08-08: 500 mg via ORAL
  Filled 2022-08-08: qty 1

## 2022-08-08 MED ORDER — IBUPROFEN 400 MG PO TABS
600.0000 mg | ORAL_TABLET | Freq: Once | ORAL | Status: DC
  Filled 2022-08-08: qty 1

## 2022-08-08 NOTE — ED Notes (Signed)
Attempted to give pt pain medication before CT. Pt tearful saying he is a single father and needs to pick his son up. Will reattempt after imaging.
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
## 2022-08-08 NOTE — ED Notes (Signed)
Patient transported to CT 
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
## 2022-08-08 NOTE — ED Notes (Signed)
Discharge packet given to [employer]. Opportunity to ask questions.
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
## 2022-08-08 NOTE — ED Notes (Signed)
Pt refusing blood work at this time. EDP made aware
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
## 2022-08-08 NOTE — ED Notes (Signed)
Patient transported to X-ray with Careers adviser

## 2022-08-08 NOTE — ED Notes (Signed)
Fast exam negative

## 2022-08-08 NOTE — ED Provider Notes (Signed)
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 calcanei, patient does appear to have bilateral fractures.  I spoke with orthopedic surgery about this.  They did request bilateral CT scans, bulky Jones splints, and close follow-up.  On reassessment, patient reports improved pain.  He has noticed swelling to the area of his right thenar eminence.  X-ray of right hand was obtained that showed soft tissue swelling only.  Bilateral bulky Jones splints were placed in the ED.  Current plan is for discharge to jail.  [facility] person who accompany the patient in the ED state that patient will not be able to receive narcotic pain medication in jail.  Prescriptions were provided for nonnarcotic multimodal pain control.  Contact information was provided for orthopedic surgery and neurosurgery follow-up.  Patient was discharged in stable condition. I ordered medication including Percocet, ibuprofen, lidocaine patch, Robaxin for analgesia Reevaluation of the patient after these medicines showed that the patient improved I have reviewed the patients home medicines and have made adjustments as  needed   Social Determinants of Health:  Will discharge to jail         Final Clinical Impression(s) / ED Diagnoses Final diagnoses:  Closed compression fracture of body of L1 vertebra (HCC)  Closed compression fracture of L2 vertebra, initial encounter (HCC)  Closed displaced fracture of body of right calcaneus, initial encounter  Closed nondisplaced fracture of body of left calcaneus, initial encounter    Rx / DC Orders ED Discharge Orders          Ordered    ibuprofen (ADVIL) 600 MG tablet  Every 6 hours  08/08/22 1609    acetaminophen (TYLENOL) 325 MG tablet  Every 6 hours        08/08/22 1609    methocarbamol (ROBAXIN) 500 MG tablet  Every 8 hours PRN        08/08/22 1609    lidocaine (LIDODERM) 5 %  Every 24 hours        08/08/22 1609              Gloris Manchesterixon, Cloe Sockwell, MD 08/08/22 [phone]
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
## 2022-08-08 NOTE — ED Notes (Signed)
Pt discharged to jail in custody. Pt helped into wheelchair, bilateral leg splints on.

## 2022-09-07 ENCOUNTER — Encounter: Payer: Self-pay | Admitting: Family Medicine

## 2022-09-07 ENCOUNTER — Ambulatory Visit (INDEPENDENT_AMBULATORY_CARE_PROVIDER_SITE_OTHER): Payer: Medicaid Other | Admitting: Family Medicine

## 2022-09-07 ENCOUNTER — Ambulatory Visit: Payer: Medicaid Other | Admitting: Physician Assistant

## 2022-09-07 VITALS — BP 123/87 | HR 78 | Resp 16 | Ht 67.0 in | Wt 131.4 lb

## 2022-09-07 DIAGNOSIS — S92002A Unspecified fracture of left calcaneus, initial encounter for closed fracture: Secondary | ICD-10-CM | POA: Diagnosis not present

## 2022-09-07 DIAGNOSIS — S92001D Unspecified fracture of right calcaneus, subsequent encounter for fracture with routine healing: Secondary | ICD-10-CM

## 2022-09-07 DIAGNOSIS — S32010A Wedge compression fracture of first lumbar vertebra, initial encounter for closed fracture: Secondary | ICD-10-CM | POA: Diagnosis not present

## 2022-09-07 DIAGNOSIS — S32010S Wedge compression fracture of first lumbar vertebra, sequela: Secondary | ICD-10-CM | POA: Diagnosis not present

## 2022-09-07 DIAGNOSIS — F0781 Postconcussional syndrome: Secondary | ICD-10-CM | POA: Insufficient documentation

## 2022-09-07 DIAGNOSIS — S92001A Unspecified fracture of right calcaneus, initial encounter for closed fracture: Secondary | ICD-10-CM | POA: Diagnosis not present

## 2022-09-07 DIAGNOSIS — S92002D Unspecified fracture of left calcaneus, subsequent encounter for fracture with routine healing: Secondary | ICD-10-CM

## 2022-09-07 DIAGNOSIS — R42 Dizziness and giddiness: Secondary | ICD-10-CM | POA: Diagnosis not present

## 2022-09-07 MED ORDER — OXYCODONE-ACETAMINOPHEN 5-325 MG PO TABS: 1.0000 | ORAL_TABLET | Freq: Four times a day (QID) | ORAL | 0 refills | Status: AC | PRN

## 2022-09-07 MED ORDER — MECLIZINE HCL 12.5 MG PO TABS: 12.5000 mg | ORAL_TABLET | Freq: Three times a day (TID) | ORAL | 0 refills | Status: AC | PRN
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
## 2022-09-07 NOTE — Patient Instructions (Addendum)
Please use the splints that you were provided in the ED.   I have placed a referral to orthopedics so you should hear from them to make an appointment in the next few weeks.   Please use the prescription provided to help with pain in the meantime    Please follow up with me in 1 month for vertigo.   If you begin to have worsened headache or the feeling that you may pass out, please be seen in the ED.

## 2022-09-07 NOTE — Assessment & Plan Note (Signed)
Continued severe pain reported  Has not been wearing back brace at home  Has been treating pain with Tylenol  Will submit referral to orthopedics as he was recommended for ED follow up

## 2022-09-07 NOTE — Assessment & Plan Note (Signed)
Fractures occurred after fall in Aug 2023 Patient was seen in ED when xrays showed fractures He was recommended to use splints and follow up with ortho  Referral submitted to orthopedic surgery  Recommended that he be non-weight bearing and wear splints that he was given in the ED until follow up with ortho  Patient prescribed oxycodone-acetaminophen 5-325 every 6 hours PRN

## 2022-09-07 NOTE — Progress Notes (Deleted)
     I,Sha'taria Caryl Manas,acting as a Education administrator for [employer], PA-C.,have documented all relevant documentation on the behalf of Mikey Kirschner, PA-C,as directed by  Mikey Kirschner, PA-C while in the presence of Mikey Kirschner, PA-C.   Established patient visit   Patient: James Garner   DOB: [birth year]   46 y.o. Male  MRN: [id number] Visit Date: 09/07/2022  Today's healthcare provider: Mikey Kirschner, PA-C   No chief complaint on file.  Subjective    HPI  Patient is in today due to being in [place].  Medications: No outpatient medications prior to visit.   No facility-administered medications prior to visit.    Review of Systems  {Labs  Heme  Chem  Endocrine  Serology  Results Review (optional):23779}   Objective    There were no vitals taken for this visit. {Show previous vital signs (optional):23777}  Physical Exam  ***  No results found for any visits on 09/07/22.  Assessment & Plan     ***  No follow-ups on file.      {provider attestation***:1}   Mikey Kirschner, PA-C  [facility] [phone] (phone) [phone] (fax)  [place]

## 2022-09-07 NOTE — Assessment & Plan Note (Addendum)
Improving  Patient reports hitting his head during fall when he fell  Has hx of vertigo and symptoms appear to be re-occurring after fall especially with lying on his left side  He denies confusion, memory loss, but states that he fells less "clear in thoughts" than before  He denies HA today  Recommended continued cognitive rest as he likely has percussion based on negative results of Head CT

## 2022-09-16 ENCOUNTER — Ambulatory Visit: Payer: Self-pay | Admitting: *Deleted

## 2022-09-16 DIAGNOSIS — B001 Herpesviral vesicular dermatitis: Secondary | ICD-10-CM

## 2022-09-16 DIAGNOSIS — K120 Recurrent oral aphthae: Secondary | ICD-10-CM | POA: Insufficient documentation

## 2022-09-16 MED ORDER — VALACYCLOVIR HCL 1 G PO TABS: 1000.0000 mg | ORAL_TABLET | Freq: Three times a day (TID) | ORAL | 0 refills | Status: AC

## 2022-09-16 NOTE — Telephone Encounter (Signed)
Attempted call. Call can not be completed.

## 2022-09-16 NOTE — Telephone Encounter (Signed)
Will prescribe valtrex 1000mg  every 8 hours for oral sores for 7 day course. Recommend that patient be evaluated in UC or ED if he is having difficulty swallowing or develops trouble breathing or cannot maintain saliva.    [name], MD  [facility]  [phone]

## 2022-09-16 NOTE — Telephone Encounter (Signed)
  Chief Complaint: severe pain in side of mouth. Reports ulcers or lesions,"zosters" on sides of tongue and inside mouth at lip area Symptoms: pain , difficulty and pain with swallowing Frequency: 4-5 days ago Pertinent Negatives: Patient denies fever Disposition: [] ED /[x] Urgent Care (no appt availability in office) / [] Appointment(In office/virtual)/ []  [place] [facility]/ [] Home Care/ [] Refused Recommended Disposition /[] [place] [facility]/ []  Follow-up with PCP Additional Notes:   Patient reports PCP knows him and requesting medication that was prescribed for this same issue in 2021. Please advise. Patient recommended to go to UC and would like to hear from PCP first.     Reason for Disposition  [1] SEVERE mouth pain (e.g., excruciating) AND [2] not improved after 2 hours of pain medicine  Answer Assessment - Initial Assessment Questions 1. ONSET: "When did the mouth start hurting?" (e.g., hours or days ago)      4-5 days ago 2. SEVERITY: "How bad is the pain?" (Scale 1-10; mild, moderate or severe)   - MILD (1-3):  doesn't interfere with eating or normal activities   - MODERATE (4-7): interferes with eating some solids and normal activities   - SEVERE (8-10):  excruciating pain, interferes with most normal activities   - SEVERE DYSPHAGIA: can't swallow liquids, drooling     Severe, difficulty and pain with swallowing  3. SORES: "Are there any sores or ulcers in the mouth?" If Yes, ask: "What part of the mouth are the sores in?"     Yes on tongue and inner mouth under lip area 4. FEVER: "Do you have a fever?" If Yes, ask: "What is your temperature, how was it measured, and when did it start?"     no 5. CAUSE: "What do you think is causing the mouth pain?"     Stress  6. OTHER SYMPTOMS: "Do you have any other symptoms?" (e.g., difficulty breathing)     No  Protocols used: Mouth Pain-A-AH

## 2022-09-19 ENCOUNTER — Telehealth: Payer: Medicaid Other | Admitting: Family Medicine

## 2022-09-19 NOTE — Telephone Encounter (Signed)
Call cannot be completed  

## 2022-09-21 DIAGNOSIS — M47812 Spondylosis without myelopathy or radiculopathy, cervical region: Secondary | ICD-10-CM | POA: Insufficient documentation

## 2022-09-21 DIAGNOSIS — M50323 Other cervical disc degeneration at C6-C7 level: Secondary | ICD-10-CM | POA: Insufficient documentation

## 2022-09-23 ENCOUNTER — Telehealth: Payer: Self-pay | Admitting: Family Medicine

## 2022-09-23 DIAGNOSIS — S92002A Unspecified fracture of left calcaneus, initial encounter for closed fracture: Secondary | ICD-10-CM | POA: Diagnosis not present

## 2022-09-23 DIAGNOSIS — S92001A Unspecified fracture of right calcaneus, initial encounter for closed fracture: Secondary | ICD-10-CM | POA: Diagnosis not present

## 2022-09-23 NOTE — Telephone Encounter (Signed)
Medication Refill - Medication: oxyCODONE-acetaminophen (PERCOCET/ROXICET) 5-325 MG per tablet  Pt also states that he needs Famciclovir instead of Valacyclovir / due to that medication not working for him   Has the patient contacted their pharmacy? Yes  (Agent: If no, request that the patient contact the pharmacy for the refill. If patient does not wish to contact the pharmacy document the reason why and proceed with request.) (Agent: If yes, when and what did the pharmacy advise?) Pt needs to call pcp to request   Preferred Pharmacy (with phone number or street name): [id number] [address], [place] - [address]  [place], [place] [address]  Phone:  [phone]  Fax:  [phone]  DEA #:  [id number] Has the patient been seen for an appointment in the last year OR does the patient have an upcoming appointment? Yes.    Agent: Please be advised that RX refills may take up to 3 business days. We ask that you follow-up with your pharmacy.

## 2022-09-26 ENCOUNTER — Ambulatory Visit: Payer: Self-pay | Admitting: *Deleted

## 2022-09-26 MED ORDER — FAMCICLOVIR 500 MG PO TABS: 500.0000 mg | ORAL_TABLET | Freq: Three times a day (TID) | ORAL | 0 refills | Status: DC

## 2022-09-26 NOTE — Telephone Encounter (Signed)
Rx for famciclovir 500mg  TID sent electronically to to [place], MD  [facility]  [phone]

## 2022-09-26 NOTE — Telephone Encounter (Signed)
  Chief Complaint: shingles in mouth Symptoms: pain, break out Frequency: finished Valtrex it did not help, he wants Fanciclovir like before Pertinent Negatives: Patient denies fever Disposition: [] ED /[] Urgent Care (no appt availability in office) / [] Appointment(In office/virtual)/ []  [place] [facility]/ [] Home Care/ [] Refused Recommended Disposition /[] [place] [facility]/ [x]  Follow-up with PCP Additional Notes: Pt finished Valtrex and states it did not help at all, he is requesting Fanciclovir like he took last time, it helped a lot. I could not request it because it is expired.   Answer Assessment - Initial Assessment Questions 1. DRUG NAME: "What medicine do you need to have refilled?"     Famciclovir   4. PRESCRIBING HCP: "Who prescribed it?" Reason: If prescribed by specialist, call should be referred to that group.     Dr B 5. SYMPTOMS: "Do you have any symptoms?"     Outbreak in mouth 6. PREGNANCY: "Is there any chance that you are pregnant?" "When was your last menstrual period?"     na  Protocols used: Medication Refill and Renewal Call-A-AH

## 2022-10-06 ENCOUNTER — Ambulatory Visit: Payer: Medicaid Other | Admitting: Family Medicine

## 2022-10-06 ENCOUNTER — Encounter: Payer: Self-pay | Admitting: Family Medicine

## 2022-10-06 VITALS — BP 100/65 | HR 74 | Temp 98.4°F | Resp 16 | Ht 66.5 in | Wt 147.6 lb

## 2022-10-06 DIAGNOSIS — K120 Recurrent oral aphthae: Secondary | ICD-10-CM | POA: Diagnosis not present

## 2022-10-06 DIAGNOSIS — S92002D Unspecified fracture of left calcaneus, subsequent encounter for fracture with routine healing: Secondary | ICD-10-CM | POA: Diagnosis not present

## 2022-10-06 DIAGNOSIS — S32010S Wedge compression fracture of first lumbar vertebra, sequela: Secondary | ICD-10-CM | POA: Diagnosis not present

## 2022-10-06 DIAGNOSIS — S32010D Wedge compression fracture of first lumbar vertebra, subsequent encounter for fracture with routine healing: Secondary | ICD-10-CM | POA: Diagnosis not present

## 2022-10-06 DIAGNOSIS — S92001D Unspecified fracture of right calcaneus, subsequent encounter for fracture with routine healing: Secondary | ICD-10-CM | POA: Diagnosis not present

## 2022-10-06 DIAGNOSIS — M722 Plantar fascial fibromatosis: Secondary | ICD-10-CM | POA: Insufficient documentation

## 2022-10-06 MED ORDER — OXYCODONE-ACETAMINOPHEN 5-325 MG PO TABS: ORAL_TABLET | ORAL | 0 refills | Status: AC

## 2022-10-06 MED ORDER — DEXAMETHASONE 0.5 MG/5ML PO SOLN: ORAL | 0 refills | Status: DC

## 2022-10-06 MED ORDER — GABAPENTIN 100 MG PO CAPS: 100.0000 mg | ORAL_CAPSULE | Freq: Every day | ORAL | 1 refills | Status: AC

## 2022-10-06 NOTE — Assessment & Plan Note (Signed)
Continued pain  Refill provided for oxycodone-acetaminophen 5-325mg  every 6 hours PRN  He will follow up with orthopedics next month and with PCP in 6 weeks

## 2022-10-06 NOTE — Assessment & Plan Note (Addendum)
Recurrent problem  Never received diagnosis of HSV  Considered bechet with recurrent ulcers, patient denies genital ulcers  Patient has two 14mm lesions present on inferior right tongue and inner lip without orophargyngeal lesions  Will obtain HSV culture swab today  Recommended completing antiviral therapy with famiciclovir 500 mg TID  Will prescribe dexamethasone solution to apply topically 0.5mg  twice daily for 1 week

## 2022-10-06 NOTE — Assessment & Plan Note (Signed)
Acute hx consistent with plantar fascitis  Recommended frozen water bottle and tennis ball stretches throughout the day every day  Will follow up in 6 weeks

## 2022-10-06 NOTE — Assessment & Plan Note (Signed)
Reports continued pain, now more focused in soles of his feet and toes, w/ worse pain on the right Recommended gabapentin 100-300mg  qHS and titration to 300mg  TID if needed  Will add additional three days of oxycodone-acetaminophen 5-325mg  due to severity of pain, discussed that this would not be a longterm solution and will provide only a limited supply today  Patient will follow up in 3 weeks

## 2022-10-06 NOTE — Progress Notes (Signed)
I,Joseline E Rosas,acting as a scribe for [employer], MD.,have documented all relevant documentation on the behalf of James Foster, MD,as directed by  James Foster, MD while in the presence of James Foster, MD.   Established patient visit   Patient: James Garner   DOB: 19-Aug-1976   46 y.o. Male  MRN: [id number] Visit Date: 10/06/2022  Today's healthcare provider: [name], MD   Chief Complaint  Patient presents with   Follow-Up Fractures and HSV Flare   Subjective    HPI   Follow Up Fractures  Foot Pain Patient presents for follow up after seeing orthopedics for bilateral fractures in his feet and in his back  He has completed therapies including oxycodone 5-325mg , melxoicam 7.5mg  twice daily for 2 weeks and then as needed, voltaren gel He was evaluated at [facility] on 09/23/22 for chronic neck pain  Reports that he need a refill on the oxycodone. Reports that he is not better.   He reports having excruciating pain in his toes and the bottom of his feet  The pain radiates throughout his toes  He states that the toes and bottom of his feet are the worst pain  He reports constant pain in his heels  He reports that the back pain is worse with laughter or trying to work on the computer, he describes the pain as sharp  Movements that worsen his back pain include lifting, sometimes he has pain with rotation of the torso and other times he does not Today he would like to discuss pain. Reports that his pain is not getting any better. He reports heel pain. Pain in his toes which is worsen. He is not able to wear regular shoes. Reports that he went to the orthopedics but was just looking at his heel. Overall he has extreme pain in his right knee, back and right foot. Stiffness is present all day long.  Orthopedic follow up was scheduled for 3 weeks and he was not recommended for surgery at the time, he was instructed to  wear gel shoe inserts and no longer needed the casts   He reports some concern for hx of PUD but states he was never diagnosed so he is careful to take NSAIDS  He adds that he has noticed pain on the sole of his right foot that is worse with the first steps in the morning  He describes the pain as stretching from the heel to his toes, there is pain that is shooting on the top and bottom of the toes   Follow up Oral Ulcers   He was also treated for painful oral ulcers  He completed a course of valcyclovir with no improvement followed by resolution of no improvement of  symptoms after taking famciclovir for 7days reports that he still feels the oral ulcers He reports that in his 48s he had shingles infection on his right torso The valtrex seemed to help for three days and then the ulcers reappeared  After taking the famiclovir, the ulcers seemed to decrease in size but yesterday he started to feel small areas of soreness in his mouth     Medications: Outpatient Medications Prior to Visit  Medication Sig   famciclovir (FAMVIR) 500 MG tablet Take 1 tablet (500 mg total) by mouth 3 (three) times daily.   meclizine (ANTIVERT) 12.5 MG tablet Take 1 tablet (12.5 mg total) by mouth 3 (three) times daily as needed for dizziness.   naproxen (NAPROSYN) 500 MG tablet Take  by mouth.   diclofenac Sodium (VOLTAREN) 1 % GEL Apply 2 g topically 4 (four) times daily.   meloxicam (MOBIC) 7.5 MG tablet TAKE 1 TABLET TWICE A DAY FOR 2 WEEKS AND THEN AS NEEDED   [DISCONTINUED] oxyCODONE-acetaminophen (PERCOCET/ROXICET) 5-325 MG tablet TAKE 1 TABLET BY MOUTH EVERY 6 (SIX) HOURS AS NEEDED FOR UP TO 5 DAYS FOR SEVERE PAIN.   No facility-administered medications prior to visit.    Review of Systems     Objective    BP 100/65 (BP Location: Left Arm, Patient Position: Sitting, Cuff Size: Normal)   Pulse 74   Temp 98.4 F (36.9 C) (Oral)   Resp 16   Ht 5' 6.5" (1.689 m)   Wt 147 lb 9.6 oz (67 kg)   BMI  23.47 kg/m    Physical Exam Constitutional:      General: He is not in acute distress.    Appearance: He is not ill-appearing, toxic-appearing or diaphoretic.  HENT:     Mouth/Throat:     Dentition: Dental caries present. No gum lesions.     Tongue: Lesions present.     Palate: Mass present. No lesions.     Pharynx: No pharyngeal swelling, oropharyngeal exudate or posterior oropharyngeal erythema.     Tonsils: No tonsillar exudate or tonsillar abscesses.      Comments: Bilateral torre in upper hard palate   8mm ulcer on buccal surface of lip on right  Inferior surface of tongue on right with 19mm ulcerated lesion with erythematous base  Cardiovascular:     Pulses:          Dorsalis pedis pulses are 2+ on the right side and 2+ on the left side.       Posterior tibial pulses are 2+ on the right side and 2+ on the left side.  Musculoskeletal:     Right foot: Normal range of motion. No deformity, Charcot foot or foot drop.     Left foot: Normal range of motion. No deformity, Charcot foot or foot drop.  Feet:     Right foot:     Skin integrity: Skin integrity normal. No warmth or fissure.     Toenail Condition: Right toenails are normal.     Left foot:     Skin integrity: Skin integrity normal. No warmth or fissure.     Toenail Condition: Left toenails are normal.     Comments: Tenderness with manipulation of all digits on right  Decreased sensation on lateral, distal edge of left great toe  No lacerations, no edema of the feet  Patient ambulating in soft shoes with gel heel inserts bilaterally  Neurological:     Mental Status: He is alert.       No results found for any visits on 10/06/22.  Assessment & Plan     Problem List Items Addressed This Visit       Digestive   Oral aphthous ulcer    Recurrent problem  Never received diagnosis of HSV  Considered bechet with recurrent ulcers, patient denies genital ulcers  Patient has two 65mm lesions present on inferior  right tongue and inner lip without orophargyngeal lesions  Will obtain HSV culture swab today  Recommended completing antiviral therapy with famiciclovir 500 mg TID  Will prescribe dexamethasone solution to apply topically 0.5mg  twice daily for 1 week       Relevant Orders   Herpes simplex virus culture     Musculoskeletal and Integument   Closed fracture of  both calcanei    Reports continued pain, now more focused in soles of his feet and toes, w/ worse pain on the right Recommended gabapentin 100-300mg  qHS and titration to 300mg  TID if needed  Will add additional three days of oxycodone-acetaminophen 5-325mg  due to severity of pain, discussed that this would not be a longterm solution and will provide only a limited supply today  Patient will follow up in 3 weeks       Closed compression fracture of first lumbar vertebra (HCC) - Primary     Continued pain  Refill provided for oxycodone-acetaminophen 5-325mg  every 6 hours PRN  He will follow up with orthopedics next month and with PCP in 6 weeks       Plantar fasciitis of right foot    Acute hx consistent with plantar fascitis  Recommended frozen water bottle and tennis ball stretches throughout the day every day  Will follow up in 6 weeks         Return in about 6 weeks (around 11/17/2022) for fractures, foot pain.       14/06/2022, MD  [facility] [phone] (phone) [phone] (fax)  [facility]

## 2022-10-09 LAB — SPECIMEN STATUS REPORT

## 2022-10-10 LAB — HERPES SIMPLEX VIRUS CULTURE

## 2022-10-12 DIAGNOSIS — Z419 Encounter for procedure for purposes other than remedying health state, unspecified: Secondary | ICD-10-CM | POA: Diagnosis not present

## 2022-10-14 DIAGNOSIS — S92002A Unspecified fracture of left calcaneus, initial encounter for closed fracture: Secondary | ICD-10-CM | POA: Diagnosis not present

## 2022-10-14 DIAGNOSIS — S92001A Unspecified fracture of right calcaneus, initial encounter for closed fracture: Secondary | ICD-10-CM | POA: Diagnosis not present

## 2022-11-11 DIAGNOSIS — Z419 Encounter for procedure for purposes other than remedying health state, unspecified: Secondary | ICD-10-CM | POA: Diagnosis not present

## 2022-12-12 DIAGNOSIS — Z419 Encounter for procedure for purposes other than remedying health state, unspecified: Secondary | ICD-10-CM | POA: Diagnosis not present

## 2022-12-15 ENCOUNTER — Encounter: Payer: Self-pay | Admitting: Family Medicine

## 2022-12-15 ENCOUNTER — Ambulatory Visit (INDEPENDENT_AMBULATORY_CARE_PROVIDER_SITE_OTHER): Payer: Medicaid Other | Admitting: Family Medicine

## 2022-12-15 VITALS — BP 111/84 | HR 69 | Temp 98.8°F | Resp 16 | Wt 140.4 lb

## 2022-12-15 DIAGNOSIS — M25562 Pain in left knee: Secondary | ICD-10-CM | POA: Diagnosis not present

## 2022-12-15 DIAGNOSIS — K120 Recurrent oral aphthae: Secondary | ICD-10-CM | POA: Diagnosis not present

## 2022-12-15 DIAGNOSIS — M25561 Pain in right knee: Secondary | ICD-10-CM | POA: Diagnosis not present

## 2022-12-15 DIAGNOSIS — M79671 Pain in right foot: Secondary | ICD-10-CM

## 2022-12-15 DIAGNOSIS — M79672 Pain in left foot: Secondary | ICD-10-CM

## 2022-12-15 DIAGNOSIS — G8929 Other chronic pain: Secondary | ICD-10-CM | POA: Diagnosis not present
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
## 2022-12-15 NOTE — Patient Instructions (Addendum)
Please schedule an appointment with dentist to discuss the recurrent ulcers in your mouth   I have placed referrals for sports medicine for your knee pain as well as podiatry for your foot pain   Please notify our office if you do not hear anything in regards to scheduling in the next few weeks.

## 2022-12-16 DIAGNOSIS — S92002A Unspecified fracture of left calcaneus, initial encounter for closed fracture: Secondary | ICD-10-CM | POA: Diagnosis not present

## 2022-12-16 DIAGNOSIS — S92001A Unspecified fracture of right calcaneus, initial encounter for closed fracture: Secondary | ICD-10-CM | POA: Diagnosis not present

## 2022-12-18 DIAGNOSIS — G8929 Other chronic pain: Secondary | ICD-10-CM | POA: Insufficient documentation

## 2022-12-18 DIAGNOSIS — M79671 Pain in right foot: Secondary | ICD-10-CM | POA: Insufficient documentation

## 2022-12-18 DIAGNOSIS — M79672 Pain in left foot: Secondary | ICD-10-CM | POA: Insufficient documentation

## 2022-12-18 NOTE — Assessment & Plan Note (Signed)
Suspected nerve pain in setting of recent calcaneal fractures  He has tried gabapentin, with some improvement but continues to have pain most days throughout the day  Recommended continuing the gabapentin  Patient requests podiatry referral, referral submitted today

## 2022-12-18 NOTE — Assessment & Plan Note (Signed)
Recurrent problem  Negative HSV testing  Unclear etiology for recurrent ulcers  Recommended discussing with patient's dentist Patient was agreeable with this plan
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
## 2022-12-21 ENCOUNTER — Encounter: Payer: Self-pay | Admitting: Podiatry

## 2022-12-21 ENCOUNTER — Ambulatory Visit: Payer: Medicaid Other | Admitting: Podiatry

## 2022-12-21 DIAGNOSIS — M7741 Metatarsalgia, right foot: Secondary | ICD-10-CM | POA: Diagnosis not present

## 2022-12-21 DIAGNOSIS — M7751 Other enthesopathy of right foot: Secondary | ICD-10-CM

## 2022-12-21 DIAGNOSIS — M7742 Metatarsalgia, left foot: Secondary | ICD-10-CM

## 2022-12-21 DIAGNOSIS — M7752 Other enthesopathy of left foot: Secondary | ICD-10-CM | POA: Diagnosis not present
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
## 2023-01-12 DIAGNOSIS — Z419 Encounter for procedure for purposes other than remedying health state, unspecified: Secondary | ICD-10-CM | POA: Diagnosis not present

## 2023-02-01 ENCOUNTER — Encounter: Payer: Self-pay | Admitting: Podiatry

## 2023-02-01 MED ORDER — METHYLPREDNISOLONE 4 MG PO TBPK: ORAL_TABLET | ORAL | 0 refills | Status: DC

## 2023-02-10 DIAGNOSIS — Z419 Encounter for procedure for purposes other than remedying health state, unspecified: Secondary | ICD-10-CM | POA: Diagnosis not present

## 2023-03-13 DIAGNOSIS — Z419 Encounter for procedure for purposes other than remedying health state, unspecified: Secondary | ICD-10-CM | POA: Diagnosis not present

## 2023-04-12 DIAGNOSIS — Z419 Encounter for procedure for purposes other than remedying health state, unspecified: Secondary | ICD-10-CM | POA: Diagnosis not present

## 2023-04-28 DIAGNOSIS — S92001A Unspecified fracture of right calcaneus, initial encounter for closed fracture: Secondary | ICD-10-CM | POA: Diagnosis not present

## 2023-04-28 DIAGNOSIS — S92002A Unspecified fracture of left calcaneus, initial encounter for closed fracture: Secondary | ICD-10-CM | POA: Diagnosis not present

## 2023-05-13 DIAGNOSIS — Z419 Encounter for procedure for purposes other than remedying health state, unspecified: Secondary | ICD-10-CM | POA: Diagnosis not present

## 2023-06-19 ENCOUNTER — Ambulatory Visit: Payer: Medicaid Other | Admitting: Family Medicine

## 2023-06-19 ENCOUNTER — Encounter: Payer: Self-pay | Admitting: Family Medicine

## 2023-06-19 VITALS — BP 104/70 | HR 65 | Resp 13 | Ht 66.5 in | Wt 130.9 lb

## 2023-06-19 DIAGNOSIS — M546 Pain in thoracic spine: Secondary | ICD-10-CM | POA: Diagnosis not present

## 2023-06-19 DIAGNOSIS — Z1211 Encounter for screening for malignant neoplasm of colon: Secondary | ICD-10-CM | POA: Diagnosis not present

## 2023-06-19 DIAGNOSIS — G8929 Other chronic pain: Secondary | ICD-10-CM | POA: Diagnosis not present

## 2023-06-19 DIAGNOSIS — M549 Dorsalgia, unspecified: Secondary | ICD-10-CM | POA: Diagnosis not present

## 2023-06-19 DIAGNOSIS — M542 Cervicalgia: Secondary | ICD-10-CM

## 2023-06-19 MED ORDER — DULOXETINE HCL 30 MG PO CPEP: ORAL_CAPSULE | ORAL | 3 refills | Status: DC
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
## 2023-06-19 NOTE — Patient Instructions (Signed)
A referral has been placed on your behalf for neurosurgery for your neck and back pain.  Our referral coordination team or the office you will be visiting will contact you within the next 2 weeks.   If you have not received a phone call within 10 business days please let us know so that we can check into this for you.
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
## 2023-07-03 ENCOUNTER — Other Ambulatory Visit: Payer: Self-pay | Admitting: Family Medicine

## 2023-07-03 DIAGNOSIS — M542 Cervicalgia: Secondary | ICD-10-CM
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
## 2023-07-05 ENCOUNTER — Ambulatory Visit: Payer: Medicaid Other | Admitting: Orthopedic Surgery

## 2023-07-06 ENCOUNTER — Other Ambulatory Visit: Payer: Self-pay

## 2023-07-06 DIAGNOSIS — M549 Dorsalgia, unspecified: Secondary | ICD-10-CM

## 2023-07-06 MED ORDER — DULOXETINE HCL 60 MG PO CPEP: 60.0000 mg | ORAL_CAPSULE | Freq: Every day | ORAL | 3 refills | Status: DC

## 2023-07-20 ENCOUNTER — Other Ambulatory Visit: Payer: Self-pay | Admitting: Family Medicine

## 2023-07-20 DIAGNOSIS — G8929 Other chronic pain: Secondary | ICD-10-CM

## 2023-07-21 NOTE — Telephone Encounter (Signed)
Requested medications are due for refill today.  no  Requested medications are on the active medications list.  yes  Last refill. 07/06/2023 #60 3 rf  Future visit scheduled.   yes  Notes to clinic.  Pharmacy is requesting a 90 day supply.    Requested Prescriptions  Pending Prescriptions Disp Refills   DULoxetine (CYMBALTA) 60 MG capsule [Pharmacy Med Name: DULOXETINE HCL DR 60 MG CAP] 90 capsule 3    Sig: Take 1 capsule (60 mg total) by mouth daily. Please note and tell patient strength change     Psychiatry: Antidepressants - SNRI - duloxetine Failed - 07/20/2023  9:53 AM      Failed - Cr in normal range and within 360 days    Creatinine, Ser  Date Value Ref Range Status  06/13/2018 1.07 0.76 - 1.27 mg/dL Final         Failed - eGFR is 30 or above and within 360 days    GFR calc Af Amer  Date Value Ref Range Status  06/13/2018 97 >59 mL/min/1.73 Final   GFR calc non Af Amer  Date Value Ref Range Status  06/13/2018 84 >59 mL/min/1.73 Final         Passed - Completed PHQ-2 or PHQ-9 in the last 360 days      Passed - Last BP in normal range    BP Readings from Last 1 Encounters:  06/19/23 104/70         Passed - Valid encounter within last 6 months    Recent Outpatient Visits           1 month ago Screening for colon cancer   [place] [facility] Simmons-Robinson, [place], [place]   7 months ago Chronic pain of both knees   [place] [facility] [place], [place], MD   9 months ago Closed compression fracture of L1 vertebra, sequela   [place] [facility] Simmons-Robinson, [place], MD   10 months ago Closed compression fracture of L1 vertebra, sequela   [place] [facility] Simmons-Robinson, [place], MD   3 years ago Testicular mass   [facility] [place], Lavella [place], [place]       Future Appointments             In 1 week Simmons-Robinson, Tawanna Cooler, MD [facility], [facility]

## 2023-07-28 NOTE — Progress Notes (Deleted)
      Established patient visit   Patient: James Garner   DOB: [birth year]   47 y.o. Male  MRN: [id number] Visit Date: 07/31/2023  Today's healthcare provider: [name], MD   No chief complaint on file.  Subjective       Discussed the use of AI scribe software for clinical note transcription with the patient, who gave verbal consent to proceed.  History of Present Illness             Medications: Outpatient Medications Prior to Visit  Medication Sig   dexamethasone (DECADRON) 0.5 MG/5ML solution Apply to ulcers in the mouth twice daily for 7 days   diclofenac Sodium (VOLTAREN) 1 % GEL Apply 2 g topically 4 (four) times daily.   DULoxetine (CYMBALTA) 60 MG capsule Take 1 capsule (60 mg total) by mouth daily. Please note and tell patient strength change   gabapentin (NEURONTIN) 100 MG capsule Take 1-3 capsules (100-300 mg total) by mouth at bedtime. (Patient taking differently: Take 300 mg by mouth at bedtime.)   meclizine (ANTIVERT) 12.5 MG tablet Take 1 tablet (12.5 mg total) by mouth 3 (three) times daily as needed for dizziness.   meloxicam (MOBIC) 7.5 MG tablet TAKE 1 TABLET TWICE A DAY FOR 2 WEEKS AND THEN AS NEEDED   methylPREDNISolone (MEDROL DOSEPAK) 4 MG TBPK tablet 6 day dose pack - take as directed   naproxen (NAPROSYN) 500 MG tablet Take by mouth.   oxyCODONE-acetaminophen (PERCOCET/ROXICET) 5-325 MG tablet TAKE 1 TABLET BY MOUTH EVERY 6 (SIX) HOURS AS NEEDED FOR SEVERE PAIN.   No facility-administered medications prior to visit.    Review of Systems  {Insert previous labs (optional):23779} {See past labs  Heme  Chem  Endocrine  Serology  Results Review (optional):1}   Objective    There were no vitals taken for this visit. {Insert last BP/Wt (optional):23777}{See vitals history (optional):1}   Physical Exam  ***  No results found for any visits on 07/31/23.  Assessment & Plan     Problem List Items Addressed This Visit    None   Assessment and Plan              No follow-ups on file.         [name], MD  [facility] [phone] (phone) [phone] (fax)  [facility]

## 2023-07-31 ENCOUNTER — Ambulatory Visit: Payer: Medicaid Other | Admitting: Family Medicine

## 2024-04-03 ENCOUNTER — Ambulatory Visit: Payer: Self-pay | Admitting: Family Medicine

## 2024-04-03 ENCOUNTER — Encounter: Payer: Self-pay | Admitting: Family Medicine

## 2024-04-03 ENCOUNTER — Ambulatory Visit: Payer: Self-pay

## 2024-04-03 VITALS — BP 115/68 | HR 83 | Ht 66.0 in | Wt 141.0 lb

## 2024-04-03 DIAGNOSIS — M25562 Pain in left knee: Secondary | ICD-10-CM

## 2024-04-03 DIAGNOSIS — K120 Recurrent oral aphthae: Secondary | ICD-10-CM

## 2024-04-03 DIAGNOSIS — M47812 Spondylosis without myelopathy or radiculopathy, cervical region: Secondary | ICD-10-CM

## 2024-04-03 DIAGNOSIS — S92001D Unspecified fracture of right calcaneus, subsequent encounter for fracture with routine healing: Secondary | ICD-10-CM

## 2024-04-03 DIAGNOSIS — S92002D Unspecified fracture of left calcaneus, subsequent encounter for fracture with routine healing: Secondary | ICD-10-CM

## 2024-04-03 DIAGNOSIS — M25561 Pain in right knee: Secondary | ICD-10-CM

## 2024-04-03 DIAGNOSIS — M79672 Pain in left foot: Secondary | ICD-10-CM

## 2024-04-03 DIAGNOSIS — F3289 Other specified depressive episodes: Secondary | ICD-10-CM

## 2024-04-03 DIAGNOSIS — M79671 Pain in right foot: Secondary | ICD-10-CM

## 2024-04-03 DIAGNOSIS — F4321 Adjustment disorder with depressed mood: Secondary | ICD-10-CM

## 2024-04-03 DIAGNOSIS — G8929 Other chronic pain: Secondary | ICD-10-CM

## 2024-04-03 DIAGNOSIS — S32010S Wedge compression fracture of first lumbar vertebra, sequela: Secondary | ICD-10-CM

## 2024-04-03 MED ORDER — SERTRALINE HCL 25 MG PO TABS: 25.0000 mg | ORAL_TABLET | Freq: Every day | ORAL | 3 refills | Status: AC

## 2024-04-03 MED ORDER — DEXAMETHASONE 0.5 MG/5ML PO SOLN: 0.5000 mg | Freq: Two times a day (BID) | ORAL | 0 refills | Status: AC

## 2024-04-03 MED ORDER — PREDNISONE 20 MG PO TABS: 20.0000 mg | ORAL_TABLET | Freq: Every day | ORAL | 0 refills | Status: AC

## 2024-04-03 NOTE — Progress Notes (Unsigned)
 ACUTE PATIENT VISIT    Patient: James Garner   DOB: February 13, 1976   48 y.o. Male  MRN: [id number] Visit Date: 04/03/2024  Today's healthcare provider: Mimi Alt, MD   PCP: Mimi Alt, MD   Chief Complaint  Patient presents with   Mouth Lesions    Recurrent mouth blister     Subjective     HPI     Mouth Lesions    Additional comments: Recurrent mouth blister       Last edited by Bart Lieu, CMA on 04/03/2024  2:02 PM.       Discussed the use of AI scribe software for clinical note transcription with the patient, who gave verbal consent to proceed.  History of Present Illness          Discussed the use of AI scribe software for clinical note transcription with the patient, who gave verbal consent to proceed.  History of Present Illness James Garner is a 48 year old male who presents with recurrent oral lesions.  He has been experiencing recurrent painful oral ulcers since mid-March, located on the tongue, inner cheek, and inner lip. The current lesions are on the bottom left and far posterior right of the tongue, causing difficulty in eating. Previous testing for herpes simplex virus (HSV) was negative, and treatments with Valtrex , famciclovir , and Decadron  oral rinse have not provided relief.  He attributes the recurrence of his oral lesions to significant stress related to legal issues involving his business. He was incarcerated under false accusations of drug dealing, leading to financial and personal stress, including a $50,000 bond and legal fees. His oral lesions reappeared during incarceration, and he has been feeling depressed since then.  He has a history of hip injuries and chronic foot pain, for which he uses CBD products for relief. Meloxicam  7.5 mg, which he received while incarcerated, did not alleviate his foot pain. He has previously been on gabapentin  and meloxicam  for pain management.  He expresses feelings of  depression, stating that he feels 'totally against society' and 'in a bad place mentally.' He reports sleeping a lot but lacking motivation to engage in activities. He is concerned about the impact of his legal issues on his family, particularly his nine-year-old son, who witnessed his incarceration.  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 He has a long-term partner and a nine-year-old son. No sores or ulcers elsewhere on his body. He reports chronic foot pain and occasional back pain, particularly with strenuous activity.       Past Medical History:  Diagnosis Date   Hemorrhoids     Medications: Outpatient Medications Prior to Visit  Medication Sig   diclofenac Sodium (VOLTAREN) 1 % GEL Apply 2 g topically 4 (four) times daily.   gabapentin  (NEURONTIN ) 100 MG capsule Take 1-3 capsules (100-300 mg total) by mouth at bedtime. (Patient taking differently: Take 300 mg by mouth at bedtime.)   meclizine  (ANTIVERT ) 12.5 MG tablet Take 1 tablet (12.5 mg total) by mouth 3 (three) times daily as needed for dizziness.   naproxen  (NAPROSYN ) 500 MG tablet Take by mouth.   oxyCODONE -acetaminophen  (PERCOCET/ROXICET) 5-325 MG tablet TAKE 1 TABLET BY MOUTH EVERY 6 (SIX) HOURS AS NEEDED FOR SEVERE PAIN.   [DISCONTINUED] dexamethasone  (DECADRON ) 0.5 MG/5ML solution Apply to ulcers in the mouth twice daily for 7 days   [DISCONTINUED] DULoxetine  (CYMBALTA ) 60 MG capsule Take 1 capsule (60 mg total) by  mouth daily. Please note and tell patient strength change   [DISCONTINUED] meloxicam  (MOBIC ) 7.5 MG tablet TAKE 1 TABLET TWICE A DAY FOR 2 WEEKS AND THEN AS NEEDED   [DISCONTINUED] methylPREDNISolone  (MEDROL  DOSEPAK) 4 MG TBPK tablet 6 day dose pack - take as directed   No facility-administered medications prior to visit.    Review of Systems  Last CBC Lab Results  Component Value Date   WBC 5.0 06/13/2018   HGB 13.8 06/13/2018   HCT 41.2 06/13/2018    MCV 96 06/13/2018   MCH 32.1 06/13/2018   RDW 12.3 06/13/2018   PLT 206 06/13/2018   Last metabolic panel Lab Results  Component Value Date   GLUCOSE 89 06/13/2018   NA 142 06/13/2018   K 4.4 06/13/2018   CL 104 06/13/2018   CO2 25 06/13/2018   BUN 8 06/13/2018   CREATININE 1.07 06/13/2018   GFRNONAA 84 06/13/2018   CALCIUM 9.5 06/13/2018   PROT 7.4 06/13/2018   ALBUMIN 4.5 06/13/2018   LABGLOB 2.9 06/13/2018   AGRATIO 1.6 06/13/2018   BILITOT 0.5 06/13/2018   ALKPHOS 62 06/13/2018   AST 19 06/13/2018   ALT 8 06/13/2018    No results found for the last 90 days.      Objective    BP 115/68   Pulse 83   Ht 5\' 6"  (1.676 m)   Wt 141 lb (64 kg)   SpO2 100%   BMI 22.76 kg/m  BP Readings from Last 3 Encounters:  04/03/24 115/68  06/19/23 104/70  12/15/22 111/84   Wt Readings from Last 3 Encounters:  04/03/24 141 lb (64 kg)  06/19/23 130 lb 14.4 oz (59.4 kg)  12/15/22 140 lb 6.4 oz (63.7 kg)        Physical Exam  Physical Exam HEENT: Oral ulcers on the bottom left and far posterior right tongue, no bleeding nor drainage, lesions or not friable, they are ovoid in shape, have beige color to bases MSK: normal gait observed, patient able to ambulate independently, has tenderness with pressure applied to heels, has normal ROM of spine     No results found for any visits on 04/03/24.  Assessment & Plan     Problem List Items Addressed This Visit       Digestive   Oral aphthous ulcer - Primary   Recurrent oral ulcers on the tongue and inner cheek since mid-March. Negative HSV testing. Lesions are painful and interfere with eating. Suspected stress-related or possibly autoimmune, with stomatitis as a differential diagnosis. Previous treatments with Valtrex  and famciclovir  were ineffective. Consider oral prednisone  and Decadron  oral rinse to reduce inflammation and promote healing. - Prescribe prednisone  for 5 days to reduce inflammation. - Prescribe Decadron   oral rinse (0.5 mg/5 mL) to be applied to ulcers twice daily for 7 days.      Relevant Medications   dexamethasone  (DECADRON ) 0.5 MG/5ML solution   predniSONE  (DELTASONE ) 20 MG tablet     Musculoskeletal and Integument   Closed fracture of both calcanei   Relevant Orders   Ambulatory referral to Pain Clinic   Closed compression fracture of first lumbar vertebra [facility])   Relevant Orders   Ambulatory referral to Pain Clinic   Cervical spondylosis   Relevant Orders   Ambulatory referral to Pain Clinic     Other   Reaction, adjustment, with depressed mood, brief   Depression likely exacerbated by recent legal and personal stressors. Symptoms include lack of motivation, feelings of hopelessness, and impact  on family dynamics. Expresses interest in medication to manage symptoms. Zoloft  chosen to stabilize mood by maintaining serotonin levels. - Prescribe Zoloft  25 mg daily, with the option to increase to 50 mg after one week if needed. - Schedule follow-up appointment in one month, with the option for a virtual visit, to assess response to treatment.        Relevant Medications   sertraline  (ZOLOFT ) 25 MG tablet   Foot pain, bilateral   Chronic foot pain primarily in the feet, with trauma to the heel. Pain persists despite meloxicam  and gabapentin . Relief reported with CBD products. No surgical intervention recommended. Pain exacerbated by physical activity and alleviated by rest. - Refer to pain management for further evaluation and management.      Relevant Orders   Ambulatory referral to Pain Clinic   Chronic pain of both knees   Relevant Medications   dexamethasone  (DECADRON ) 0.5 MG/5ML solution   predniSONE  (DELTASONE ) 20 MG tablet   sertraline  (ZOLOFT ) 25 MG tablet   Other Relevant Orders   Ambulatory referral to Pain Clinic    Assessment & Plan     Return in about 6 weeks (around 05/15/2024) for depression  .         Mimi Alt, MD  [facility] [phone] (phone) [phone] (fax)  [facility]

## 2024-04-03 NOTE — Telephone Encounter (Signed)
 Chief Complaint: mouth blisters Symptoms: blisters in mouth Frequency: ongoing for a few weeks Pertinent Negatives: Patient denies fever, rash, SOB Disposition: [] ED /[] Urgent Care (no appt availability in office) / [x] Appointment(In office/virtual)/ []  [place] [facility]/ [] Home Care/ [] Refused Recommended Disposition /[] [place] [facility]/ []  Follow-up with PCP Additional Notes: Pt states he has had this before and was told he had herpes. Pt states that the valtrex  given in the past for this does not work. Pt states they gave him a steroid mouth wash and a pill that started with an "F". Pt scheduled for today.  Copied from CRM [id number]. Topic: Clinical - Red Word Triage >> Apr 03, 2024 11:22 AM Crispin Dolphin wrote: Red Word that prompted transfer to Nurse Triage: Tongue outbreak - very painful, previous had it, blisters, possible shingles already tested for herpes. Almost to the point where he can't eat. Reason for Disposition  Mouth ulcer lasts > 2 weeks  Answer Assessment - Initial Assessment Questions 1. LOCATION: "Where is the ulcer located?"      Currently some blisters on the bottom of the tongue, these blisters will randomly rotate throughout the mouth every 3-4 days.  2. NUMBER: "How many ulcers are there?"      2 currently 3. SIZE: "How large is the ulcer?"      About the size of of the end of pen/pencil 4. SEVERITY: "Are they painful?" If Yes, ask: "How bad is it?"  (Scale 1-10; or mild, moderate, severe)  - MILD - eating  and drinking normally   - MODERATE - decreased liquid intake   - SEVERE - drinking very little      Mild-moderate, irritating 5. ONSET: "When did you first notice the ulcer?"      Pt feels about 2 weeks 6. RECURRENT SYMPTOM: "Have you had a mouth ulcer before?" If Yes, ask: "When was the last time?" and "What happened that time?"      Tried valtrex  last time, did not work. States what did work was steroid mouth wash and a pill that started with an  "F" 7. CAUSE: "What do you think is causing the mouth ulcer?"     unsure 8. OTHER SYMPTOMS: "Do you have any other symptoms?" (e.g., fever)     Night sweats and grinding teeth when sleeping  Protocols used: Mouth Ulcers-A-AH

## 2024-04-04 DIAGNOSIS — F4321 Adjustment disorder with depressed mood: Secondary | ICD-10-CM | POA: Insufficient documentation

## 2024-04-04 DIAGNOSIS — F32A Depression, unspecified: Secondary | ICD-10-CM | POA: Insufficient documentation

## 2024-04-04 NOTE — Assessment & Plan Note (Signed)
 Depression likely exacerbated by recent legal and personal stressors. Symptoms include lack of motivation, feelings of hopelessness, and impact on family dynamics. Expresses interest in medication to manage symptoms. Zoloft  chosen to stabilize mood by maintaining serotonin levels. - Prescribe Zoloft  25 mg daily, with the option to increase to 50 mg after one week if needed. - Schedule follow-up appointment in one month, with the option for a virtual visit, to assess response to treatment.

## 2024-04-04 NOTE — Assessment & Plan Note (Signed)
 Recurrent oral ulcers on the tongue and inner cheek since mid-March. Negative HSV testing. Lesions are painful and interfere with eating. Suspected stress-related or possibly autoimmune, with stomatitis as a differential diagnosis. Previous treatments with Valtrex  and famciclovir  were ineffective. Consider oral prednisone  and Decadron  oral rinse to reduce inflammation and promote healing. - Prescribe prednisone  for 5 days to reduce inflammation. - Prescribe Decadron  oral rinse (0.5 mg/5 mL) to be applied to ulcers twice daily for 7 days.

## 2024-04-04 NOTE — Assessment & Plan Note (Signed)
 Chronic foot pain primarily in the feet, with trauma to the heel. Pain persists despite meloxicam  and gabapentin . Relief reported with CBD products. No surgical intervention recommended. Pain exacerbated by physical activity and alleviated by rest. - Refer to pain management for further evaluation and management.

## 2024-05-15 ENCOUNTER — Ambulatory Visit: Payer: Self-pay | Admitting: Family Medicine

## 2024-05-15 NOTE — Progress Notes (Deleted)
      Established patient visit   Patient: James Garner   DOB: 1976-07-23   48 y.o. Male  MRN: [id number] Visit Date: 05/15/2024  Today's healthcare provider: Mimi Alt, MD   No chief complaint on file.  Subjective       Discussed the use of AI scribe software for clinical note transcription with the patient, who gave verbal consent to proceed.  History of Present Illness      Past Medical History:  Diagnosis Date   Hemorrhoids     Medications: Outpatient Medications Prior to Visit  Medication Sig   diclofenac Sodium (VOLTAREN) 1 % GEL Apply 2 g topically 4 (four) times daily.   gabapentin  (NEURONTIN ) 100 MG capsule Take 1-3 capsules (100-300 mg total) by mouth at bedtime. (Patient taking differently: Take 300 mg by mouth at bedtime.)   meclizine  (ANTIVERT ) 12.5 MG tablet Take 1 tablet (12.5 mg total) by mouth 3 (three) times daily as needed for dizziness.   naproxen  (NAPROSYN ) 500 MG tablet Take by mouth.   oxyCODONE -acetaminophen  (PERCOCET/ROXICET) 5-325 MG tablet TAKE 1 TABLET BY MOUTH EVERY 6 (SIX) HOURS AS NEEDED FOR SEVERE PAIN.   sertraline  (ZOLOFT ) 25 MG tablet Take 1 tablet (25 mg total) by mouth daily.   No facility-administered medications prior to visit.    Review of Systems  {Insert previous labs (optional):23779} {See past labs  Heme  Chem  Endocrine  Serology  Results Review (optional):1}   Objective    There were no vitals taken for this visit. {Insert last BP/Wt (optional):23777}{See vitals history (optional):1}    Physical Exam  ***  No results found for any visits on 05/15/24.  Assessment & Plan     Problem List Items Addressed This Visit   None   Assessment and Plan Assessment & Plan      No follow-ups on file.         Mimi Alt, MD  [facility] [phone] (phone) [phone] (fax)  [facility]
